# Patient Record
Sex: Female | Born: 1990 | Race: White | Hispanic: No | Marital: Single | Smoking: Never smoker
Health system: Southern US, Academic
[De-identification: ages and names within clinical notes are randomized; demographics above are authoritative.]

## PROBLEM LIST (undated history)

## (undated) DIAGNOSIS — F419 Anxiety disorder, unspecified: Secondary | ICD-10-CM

## (undated) DIAGNOSIS — D561 Beta thalassemia: Secondary | ICD-10-CM

## (undated) DIAGNOSIS — M419 Scoliosis, unspecified: Secondary | ICD-10-CM

## (undated) HISTORY — DX: Scoliosis, unspecified: M41.9

## (undated) HISTORY — PX: HX WISDOM TEETH EXTRACTION: SHX21

---
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
## 2012-01-08 ENCOUNTER — Emergency Department
Admission: EM | Admit: 2012-01-08 | Discharge: 2012-01-08 | Disposition: A | Discharge status: Home / Self Care | Attending: Emergency Medicine | Admitting: Emergency Medicine

## 2012-01-08 ENCOUNTER — Emergency Department (EMERGENCY_DEPARTMENT_HOSPITAL)

## 2012-01-08 DIAGNOSIS — S59909A Unspecified injury of unspecified elbow, initial encounter: Secondary | ICD-10-CM | POA: Insufficient documentation

## 2012-01-08 DIAGNOSIS — W010XXA Fall on same level from slipping, tripping and stumbling without subsequent striking against object, initial encounter: Secondary | ICD-10-CM | POA: Insufficient documentation

## 2012-01-08 MED ORDER — IBUPROFEN 800 MG TABLET
800.00 mg | ORAL_TABLET | ORAL | Status: DC
Start: 2012-01-08 — End: 2012-01-08

## 2012-01-08 NOTE — ED Nurses Note (Signed)
Initial nursing assessment complete. Upon arrival to the ED, pt is conscious, alert and oriented to person, place and time. Pt states she fell down approx. 6 steps and injured her right hand. Pt has mild deformity to the right hand.

## 2012-01-08 NOTE — ED Provider Notes (Signed)
Emergency Department Summary  ED Provider Note    HISTORY OF PRESENT ILLNESS    Narrative provider: Patient  Chief complaint: Hand Injury    Janet Kirby is a 20 y.o. female presenting with a chief complaint of fall approximately 2 hours ago. Note that the patient reports that the fall occurred while descending stairs, falling a total of 6 steps after her flip-flop caught the edge of the step. The patient reports 2 injuries, including a superficial abrasion to the left knee and more importantly, a right hand injury with continued pain and apparent deformity. The patient reports that she arrested the fall with the involved hand, but that the load was transmitted through the wrist rather than the hand as the hand was in full palmar flexion when the injury occurred. The patient now reports pain on the dorsal surface of the hand, numbness of the 3rd, 4th, and 5th digits, numbness above the wrist, and pain to palpation of the wrist. Range of motion is limited, both active and passive, most restricted in the 3rd, 4th, and 5th digits. Note that the patient is neurovascularly intact to the fingertips. The patient attempted ibuprofen therapy at home, but symptoms remained unchanged. Note that the fall was not witnessed, but the patient reports no head injury and no loss of conscious. Note that the patient does not have any history of syncope, hypertension, hypotension, seizure, arrhythmia, palpitations, nacrolepsy, or recent intoxication.    The patient reports no past medical history, except for [facility] for an upper respiratory infection. Symptoms associated with the upper respiratory infection have since improved. The patient is scheduled to complete her last dose of clarithromycin, prescribed for the upper respiratory infection, tomorrow.    The patient reports no other complaints and denies any other associated symptoms.    REVIEW OF SYSTEMS     Constitutional: No fever, chills, or fatigue  Skin: No rashes  Head: No change in hearing; no headache  Eyes: No change in vision  Cardiovascular: No chest pain, palpitations, or leg swelling  Respiratory: No cough or shortness of breath  Gastrointestinal: No abdominal pain, nausea, vomiting, constipation, or diarrhea  Genitourinary: No dysuria or frequency  Musculoskeletal: Positive for weakness and extremity pain  Neurological: No tingling, positive for numbness    All other review of systems negative.    PAST MEDICAL HISTORY    Primary care physician: [facility]    Past Medical History   Diagnosis Date   . Scoliosis      Past Surgical History   Procedure Date   . Hx no surgical procedures      Medications Prior to Admission     Medication    Clarithromycin (BIAXIN XL) 500 mg Oral Tablet Sustained Release 24 hr    Take 1,000 mg by mouth Once a day for 10 days    Benzonatate (TESSALON) 200 mg Oral Capsule    Take 1 Cap (200 mg total) by mouth Three times a day Prn cough    Drospirenone-Ethinyl Estradiol (YAZ 28) 3-20 mg-mcg Oral Tab    take 1 Tab by mouth Once a day.      fluticasone (FLONASE) 50 mcg/actuation Nasal Spray, Suspension    1 Spray by Each Nostril route Once a day.        No Known Allergies  History   Substance Use Topics   . Smoking status: Never Smoker    . Smokeless tobacco: Never Used   .  Alcohol Use: No     Family History   Problem Relation Age of Onset   . Healthy Mother    . Healthy Father      All other past medical history as documented in the History of Present Illness.    VITAL SIGNS    ED Triage Vitals   Enc Vitals Group      BP (Non-Invasive) 01/08/12 1221 110/76 mmHg      Heart Rate 01/08/12 1221 109       Respiratory Rate 01/08/12 1221 16       Temperature 01/08/12 1221 36 C (96.8 F)      Temp src --       SpO2-1 01/08/12 1221 99 %      Weight 01/08/12 1221 68.04 kg (150 lb)      Height 01/08/12 1221 1.626 m (5\' 4" )      Head Cir --       Peak Flow --       Pain Score --        Pain Loc --       Pain Edu? --       Excl. in GC? --      PHYSICAL EXAMINATION    Constitutional: No acute distress; pain score of 5/10 at baseline, increasing to 10/10 with articulation or palpation at the wrist or hand; alert and awake; oriented to person, place, time, and situation; good historian  HEENT:   Head: Normocephalic and atraumatic   Ears: Not examined   Eyes: PERRL; conjunctiva and sclera normal   Nose: No bleeding or deformity   Throat: Mucous membranes moist  Neck: Supple; ROM normal; no lymphadenopathy or thyromegaly  Heart: RRR; S1 and S2 present; no murmurs, rubs, or gallops  Lungs: No respiratory distress; no accessory muscle recruitment; breath sounds symmetric; no coughing, crackling, or wheezing  Abdomen: Soft, non-distended, and non-tender; no rebound tenderness; bowel sounds present; no guarding; no hepatosplenomegaly; no masses  Musculoskeletal: No edema; pain and additional pain to palpation of the dorsal surface of the right hand, numbness of the 3rd, 4th, and 5th digits on the right hand, numbness above the right wrist, and pain and pain to palpation of the right wrist; range of motion is limited, both active and passive, most restricted in the 3rd, 4th, and 5th digits on the right hand; neurovascularly intact to the fingertips  Skin: Warm and dry; no rash, erythema, or jaundice  Neurological: Awake and alert; oriented to person, place, time, and situation; affect and behavior normal; concentration and judgment normal    MEDICAL DECISION MAKING     After discussion with emergency department staff physician, Dr. Staci Righter, M.D., multiple diagnostic studies were ordered to evaluate for any acute musculoskeletal process including a right forearm XR, a right wrist 2 view XR, and a right hand XR. The right forearm XR demonstrated no fracture, destructive lesion or other significant abnormality. The right wrist 2 view XR demonstrated no fracture, destructive lesion or other significant abnormality. The right hand XR demonstrated no fracture, destructive lesion or other significant abnormality. After review of the imaging studies, the patient was placed in a soft split and provided with instructions on how to wrap and unwrap the splint.    The patient was advised to follow-up with orthopedics clinic to discuss her emergency department visit today. An appointment was requested with the orthopedics clinic in 1 week. The patient was instructed to continue taking over-the-counter medications for pain control, such as  ibuprofen (Motrin). The patient was also instructed to return to the emergency department if symptoms continue or worsen, and especially if symptoms include additional numbness or tingling in the fingers. The patient was advised to return to the emergency department with any chest pain, chest pressure, chest tightness, difficulty breathing, or shortness of breath. The patient was also advised to follow-up with her primary care physician, the [facility]. At the time of discharge, the patient voiced understanding of, and agreement with, the treatment plan including the plan to follow-up with the orthopedics clinic in 1 week.    Consultation: none    Disposition: discharged from emergency department with plan to follow-up with the orthopedic surgery service    Impression: right wrist injury without fracture    Dyann Ruddle, M.D.    Resident Physician, Internal Medicine  [facility]     HPIReview of SystemsPhysical ExamCourse

## 2012-01-08 NOTE — Discharge Instructions (Signed)
Please follow-up with orthopedic clinic to discuss your emergency department visit today. We have requested a follow-up appointment with orthopedics in 1 week. Your diagnosis today was right hand and wrist injury without fracture. We performed multiple diagnostic studies today including a right forearm XR, right hand XR, and right wrist XR. No fractures were discovered. We will recommend that you continue taking over-the-counter medications for pain control, such as ibuprofen (Motrin). Please return to the emergency department if symptoms continue or worsen, and especially if symptoms include additional numbness or tingling in the fingers. Please also return to the emergency department with any chest pain, chest pressure, chest tightness, difficulty breathing, or shortness of breath.

## 2012-01-08 NOTE — ED Attending Note (Signed)
 Note begun by:  Sharolyn JONETTA Idler, MD 01/08/2012, 12:48 PM    I was physically present and directly supervised this patient's care.  Patient seen and examined.  Resident / Vito / NP history and exam reviewed.   Key elements in addition to and/or correction of that documentation are as follows:    HPI :    21 y.o. female presents with chief complaint:  Hand Injury  Clemens earlier this AM on slipery stairs.      PE :     Filed Vitals:    01/08/12 1221   BP: 110/76   Pulse: 109   Temp: 36 C (96.8 F)   Resp: 16   SpO2: 99%      Decreased ROM secondary to pain.  Neuro intact.  R/U/M nerves intact motor/sensory    Data/Test :    EKG : None  Images Review by me : I personally reviewed all imaging studies  Image Reports Review by me : As above  Labs :   Labs Reviewed - No data to display      Review of Prior Data :       Prior Images : None  Prior EKG : None  Online Medical Records : None  Transfer Docs/Images : None    Clinical Impression :   Wrist pain following fall, likely contusion vs. Sprain     MDM :       ED Course :     Filed Vitals:    01/08/12 1221   BP: 110/76   Pulse: 109   Temp: 36 C (96.8 F)   Resp: 16   Height: 1.626 m (5' 4)   Weight: 68.04 kg (150 lb)   SpO2: 99%          Plan :     Orders Placed This Encounter   . XR HAND RIGHT   . XR WRIST RIGHT 2 VIEW   . XR FOREARM RIGHT     Wrist splint applied    The patient was advised to follow up with PCP/sports medicine.  They were advised to return to the emergency department if their symptoms worsen or any concerns.      Dispo :   Home     CRITICAL CARE : None

## 2012-01-19 ENCOUNTER — Ambulatory Visit (INDEPENDENT_AMBULATORY_CARE_PROVIDER_SITE_OTHER): Admitting: SURGERY OF THE HAND

## 2012-06-01 ENCOUNTER — Encounter (INDEPENDENT_AMBULATORY_CARE_PROVIDER_SITE_OTHER)

## 2012-06-01 ENCOUNTER — Ambulatory Visit (INDEPENDENT_AMBULATORY_CARE_PROVIDER_SITE_OTHER)

## 2012-06-01 ENCOUNTER — Encounter (FREE_STANDING_LABORATORY_FACILITY)
Admit: 2012-06-01 | Discharge: 2012-06-01 | Disposition: A | Discharge status: Home / Self Care | Attending: Family Medicine | Admitting: Family Medicine

## 2012-06-01 ENCOUNTER — Encounter (INDEPENDENT_AMBULATORY_CARE_PROVIDER_SITE_OTHER): Payer: Self-pay

## 2012-06-01 VITALS — BP 122/76 | HR 92 | Temp 97.7°F | Ht 64.0 in | Wt 153.8 lb

## 2012-06-01 DIAGNOSIS — Z113 Encounter for screening for infections with a predominantly sexual mode of transmission: Secondary | ICD-10-CM | POA: Insufficient documentation

## 2012-06-01 MED ORDER — DOXYCYCLINE HYCLATE 100 MG TABLET
100.00 mg | ORAL_TABLET | Freq: Two times a day (BID) | ORAL | Status: DC
Start: 2012-06-01 — End: 2012-07-10

## 2012-06-01 MED ORDER — DOXYCYCLINE MONOHYDRATE 100 MG TABLET
100.00 mg | ORAL_TABLET | Freq: Two times a day (BID) | ORAL | Status: DC
Start: 2012-06-01 — End: 2012-06-01

## 2012-06-01 MED ORDER — CEFTRIAXONE 500 MG SOLUTION FOR INJECTION
250.0000 mg | Freq: Once | INTRAMUSCULAR | Status: AC
Start: 2012-06-01 — End: 2012-06-01

## 2012-06-01 NOTE — Progress Notes (Addendum)
[facility]    Patient Name:  Janet Kirby  MRN:  [id number]  DOB:  [birth year]  Date of Service: 06/01/2012    Chief Complaint   Patient presents with   . Uti's     History of Present Illness: Suheily Birks is a 22 y.o. female who [facility] today for the above complaint.    Patient presents with complaints of suprapubic pain and dysuria that started 3-4 days prior. She states she had unprotected sex approximately 1 week prior and developed the symptoms subsequently. She denies having fevers or chills.     Main symptoms: Suprapubic pain, dysuria  Onset of symptoms: 3-4 days  Patient denies symptoms of: fever, vomiting and vaginal discharge  History of prior UTI's: none  Date of last sexual contact: 4 days  STI exposure: no known exporsure  Number of lifetime sexual partners: 3  Prior history of STI: none  Use of barrier protection: 50% of time  Over the counter medications: No    Past Medical History:    Past Medical History   Diagnosis Date   . Scoliosis      Past Surgical History:    Past Surgical History   Procedure Laterality Date   . Hx no surgical procedures       Allergies:  No Known Allergies    Medications:  Outpatient Prescriptions Marked as Taking for the 06/01/12 encounter (Office Visit) with Maree Krabbe, DO   Medication Sig   . cefTRIAXone (ROCEPHIN) Injection IM injection 250 mg by Intramuscular route One time for 1 dose   . doxycycline 100 mg Oral Tablet Take 1 Tab (100 mg total) by mouth Twice daily   . Drospirenone-Ethinyl Estradiol (YAZ 28) 3-20 mg-mcg Oral Tab take 1 Tab by mouth Once a day.     . Sertraline 20 mg/mL Oral Concentrate Take 2.5 mL by mouth Once a day     Social History:    History     Social History   . Marital Status: Single     Spouse Name: N/A     Number of Children: N/A   . Years of Education: N/A     Social History Main Topics   . Smoking status: Never Smoker    . Smokeless tobacco: Never Used   . Alcohol Use: No   . Drug Use: No    . Sexually Active: Not on file     Other Topics Concern   . Not on file     Social History Narrative    So from [place] [employer]     Family History:  Family History   Problem Relation Age of Onset   . Healthy Mother    . Healthy Father      Review of Systems:ROS is as per HPI    Physical Exam:  Vital signs:   Filed Vitals:    06/01/12 1604   BP: 122/76   Pulse: 92   Temp: 36.5 C (97.7 F)   TempSrc: Oral   Height: 1.626 m (5\' 4" )   Weight: 69.763 kg (153 lb 12.8 oz)   SpO2: 98%     Body mass index is 26.39 kg/(m^2).  Patient's last menstrual period was 05/16/2012.     General: No acute distress, alert & oriented x 3, pleasant  Lungs: Clear to auscultation bilaterally.   Cardiovascular: regular rate and rhythm, S1, S2 normal, no murmur, click, rub or gallop  Abdomen: Soft, non-tender, Bowel  sounds normal  Suprapubic exam: mild suprapubic tenderness  CVA Tenderness: no CVAT  Genitalia: Labia without erythema, blisters, ulcers, or warts, Vaginal wall without erythema, blisters, ulcers, or warts, Cervix with yellow discharge and Adenxa with mild right tenderness  Genital lesions: none    Data Reviewed:  POCT Results:    Time collected: 1632  Color: Yellow  Clarity: Cloudy  Leukocytes: Negative  Nitrite: Negative  Urobilinogen: 2mg /dL  Protein: Negative  pH: 6.5  Blood (urine): Negative  Specific Gravity: 1.020  Ketones: Negative  Bilirubin: Negative  Glucose: Negative             Assessment:   1. Cervicitis (616.0)  cefTRIAXone (ROCEPHIN) Injection IM injection, DISCONTINUED: doxycycline 100 mg Oral Tablet   2. Routine screening for STI (sexually transmitted infection) (V74.5)  Sertraline 20 mg/mL Oral Concentrate, GEN PROBE - STATE SUPPLIED - [facility] ONLY, POCT URINE DIPSTICK, POCT URINE PREGNANCY, URINE CULTURE       Plan:    Orders Placed This Encounter   . GEN PROBE - STATE SUPPLIED - [facility] ONLY   . Urine Culture   . POCT URINE DIPSTICK   . POCT URINE PREGNANCY    . cefTRIAXone (ROCEPHIN) Injection IM injection   . doxycycline 100 mg Oral Tablet     Cervicitis  - Gen probe sent  - UA negative  - Urine pregnancy negative  - Pelvic exam w/ mild yellow cervical discharge, pH normal. Mildly tender R adnexa  - Attending physician performed wet mount which was negative  - Gave Rocephin IM 250 mg once  - Gave script for doxycycline 100 mg BID x 7 days  - Patient encouraged to not have sex until Gen probe results are back.  - Return to clinic/ED if symptoms worsen or return      Patient seen with Dr. Doneen Poisson, DO  PGY-1  Department of Internal Medicine  [facility]  06/01/2012 6:27 PM    I saw and examined the patient.  I reviewed the resident's note.  I agree with the findings and plan of care as documented in the resident's note.  Any exceptions/additions are edited/noted.  Wet mount completed with no clue yeast and few wbc's    Gemma Payor, MD 06/01/2012, 10:38 PM

## 2012-06-03 LAB — URINE CULTURE

## 2012-06-14 ENCOUNTER — Other Ambulatory Visit (INDEPENDENT_AMBULATORY_CARE_PROVIDER_SITE_OTHER): Payer: Self-pay

## 2012-07-10 ENCOUNTER — Observation Stay (HOSPITAL_BASED_OUTPATIENT_CLINIC_OR_DEPARTMENT_OTHER): Admitting: Emergency Medicine

## 2012-07-10 ENCOUNTER — Encounter (HOSPITAL_COMMUNITY): Payer: Self-pay

## 2012-07-10 ENCOUNTER — Observation Stay
Admission: EM | Admit: 2012-07-10 | Discharge: 2012-07-11 | Disposition: A | Discharge status: Home / Self Care | Attending: Emergency Medicine | Admitting: Emergency Medicine

## 2012-07-10 DIAGNOSIS — F1010 Alcohol abuse, uncomplicated: Secondary | ICD-10-CM | POA: Insufficient documentation

## 2012-07-10 DIAGNOSIS — T3992XA Poisoning by unspecified nonopioid analgesic, antipyretic and antirheumatic, intentional self-harm, initial encounter: Secondary | ICD-10-CM | POA: Insufficient documentation

## 2012-07-10 DIAGNOSIS — F411 Generalized anxiety disorder: Secondary | ICD-10-CM | POA: Insufficient documentation

## 2012-07-10 DIAGNOSIS — T405X4A Poisoning by cocaine, undetermined, initial encounter: Principal | ICD-10-CM | POA: Insufficient documentation

## 2012-07-10 DIAGNOSIS — R300 Dysuria: Secondary | ICD-10-CM | POA: Insufficient documentation

## 2012-07-10 DIAGNOSIS — T39311A Poisoning by propionic acid derivatives, accidental (unintentional), initial encounter: Secondary | ICD-10-CM | POA: Insufficient documentation

## 2012-07-10 DIAGNOSIS — F3289 Other specified depressive episodes: Secondary | ICD-10-CM | POA: Insufficient documentation

## 2012-07-10 DIAGNOSIS — T50992A Poisoning by other drugs, medicaments and biological substances, intentional self-harm, initial encounter: Secondary | ICD-10-CM | POA: Insufficient documentation

## 2012-07-10 HISTORY — DX: Beta thalassemia (CMS HCC): D56.1

## 2012-07-10 HISTORY — DX: Anxiety disorder, unspecified: F41.9

## 2012-07-10 LAB — CBC/DIFF
BASOPHILS: 1 %
BASOPHILS: 1 %
BASOS ABS: 0.085 10*3/uL (ref 0.0–0.2)
BASOS ABS: 0.063 10*3/uL (ref 0.0–0.2)
EOS ABS: 0 THOU/uL (ref 0.0–0.5)
EOS ABS: 0.034 10*3/uL (ref 0.0–0.5)
EOSINOPHIL: 0 %
EOSINOPHIL: 1 %
HCT: 37.4 % (ref 33.5–45.2)
HCT: 34.3 % (ref 33.5–45.2)
HGB: 11.8 g/dL (ref 11.2–15.2)
HGB: 10.8 g/dL — ABNORMAL LOW (ref 11.2–15.2)
LYMPHOCYTES: 27 %
LYMPHOCYTES: 37 %
LYMPHS ABS: 2.295 10*3/uL (ref 1.0–4.8)
LYMPHS ABS: 2.702 THOU/uL (ref 1.0–4.8)
MCH: 19.7 pg — ABNORMAL LOW (ref 27.4–33.0)
MCH: 19.7 pg — ABNORMAL LOW (ref 27.4–33.0)
MCHC: 31.5 g/dL — ABNORMAL LOW (ref 32.5–35.8)
MCHC: 31.4 g/dL — ABNORMAL LOW (ref 32.5–35.8)
MCV: 62.6 fL — ABNORMAL LOW (ref 78–100)
MCV: 62.7 fL — ABNORMAL LOW (ref 78–100)
MONOCYTES: 5 %
MONOCYTES: 8 %
MONOS ABS: 0.425 10*3/uL (ref 0.3–1.0)
MONOS ABS: 0.556 10*3/uL (ref 0.3–1.0)
MPV: 8.3 fL (ref 7.5–11.5)
MPV: 7.9 fL (ref 7.5–11.5)
PLATELET COUNT: 265 THOU/uL (ref 140–450)
PLATELET COUNT: 251 THOU/uL (ref 140–450)
PMN ABS: 5.695 10*3/uL (ref 1.5–7.7)
PMN ABS: 3.967 10*3/uL (ref 1.5–7.7)
PMN'S: 67 %
PMN'S: 54 %
RBC: 5.97 MIL/uL — ABNORMAL HIGH (ref 3.63–4.92)
RBC: 5.47 MIL/uL — ABNORMAL HIGH (ref 3.63–4.92)
RDW: 15.3 % — ABNORMAL HIGH (ref 12.0–15.0)
RDW: 15 % (ref 12.0–15.0)
WBC: 8.5 10*3/uL (ref 3.5–11.0)
WBC: 7.3 10*3/uL (ref 3.5–11.0)

## 2012-07-10 LAB — URINALYSIS WITH MICROSCOPIC REFLEX IF INDICATED
BILIRUBIN: NEGATIVE
BLOOD: NEGATIVE
KETONES: NEGATIVE mg/dL
LEUKOCYTES: NEGATIVE
PROTEIN: NEGATIVE mg/dL
UROBILINOGEN: NORMAL mg/dL

## 2012-07-10 LAB — BASIC METABOLIC PANEL
ANION GAP: 11 mmol/L (ref 5–16)
ANION GAP: 6 mmol/L (ref 5–16)
BUN/CREAT RATIO: 12 (ref 6–22)
BUN/CREAT RATIO: 11 (ref 6–22)
BUN: 8 mg/dL (ref 6–20)
BUN: 7 mg/dL (ref 6–20)
CALCIUM: 9.3 mg/dL (ref 8.5–10.4)
CALCIUM: 8.6 mg/dL (ref 8.5–10.4)
CARBON DIOXIDE: 24 mmol/L (ref 22–32)
CARBON DIOXIDE: 28 mmol/L (ref 22–32)
CHLORIDE: 107 mmol/L (ref 96–111)
CHLORIDE: 107 mmol/L (ref 96–111)
CREATININE: 0.67 mg/dL (ref 0.49–1.10)
CREATININE: 0.66 mg/dL (ref 0.49–1.10)
ESTIMATED GLOMERULAR FILTRATION RATE: >59 ml/min/1.73m2 (ref >59)
ESTIMATED GLOMERULAR FILTRATION RATE: >59 ml/min/1.73m2 (ref >59)
GLUCOSE,NONFAST: 105 mg/dL (ref 65–139)
GLUCOSE,NONFAST: 111 mg/dL (ref 65–139)
POTASSIUM: 3.5 mmol/L (ref 3.5–5.1)
POTASSIUM: 3.7 mmol/L (ref 3.5–5.1)
SODIUM: 142 mmol/L (ref 136–145)
SODIUM: 141 mmol/L (ref 136–145)

## 2012-07-10 LAB — URINALYSIS, MICROSCOPIC
RBC'S: 1 /HPF (ref 0–4)
WBC'S: 2 /HPF (ref 0–6)

## 2012-07-10 LAB — THYROID STIMULATING HORMONE WITH FREE T4 REFLEX: THYROID STIMULATING HORMONE WITH FREE T4 REFLEX: 1.579 u[IU]/mL (ref 0.300–5.900)

## 2012-07-10 LAB — DRUG SCREEN, HIGH OPIATE CUTOFF, NO CONFIRMATION, URINE
AMPHETAMINE, URINE: NEGATIVE
BARBITURATE, URINE: NEGATIVE
BENZODIAZEPINE,URINE: NEGATIVE
BUPRENORPHINE, URINE QL: NEGATIVE
CANNABINOID (THC), URINE: NEGATIVE
COCAINE METAB. URINE: POSITIVE — AB
OPIATE, URINE: NEGATIVE
PHENCYCLIDINE, URINE: NEGATIVE
PROPOXYPHENE, URINE: NEGATIVE

## 2012-07-10 LAB — HEPATIC FUNCTION PANEL
ALBUMIN: 4.3 g/dL (ref 3.5–4.8)
ALKALINE PHOSPHATASE: 64 U/L (ref 38–126)
ALT (SGPT): 37 U/L (ref 7–45)
AST (SGOT): 31 U/L (ref 8–41)
BILIRUBIN, TOTAL: 0.9 mg/dL (ref 0.3–1.3)
BILIRUBIN,CONJUGATED: <0.2 mg/dl (ref 0.0–0.3)
TOTAL PROTEIN: 8 g/dL (ref 6.4–8.3)

## 2012-07-10 LAB — CREATINE KINASE (CK), TOTAL, SERUM OR PLASMA: CREATINE KINASE (CK): 111 U/L (ref 24–170)

## 2012-07-10 LAB — TRICYCLIC SCREEN: TRICYCLIC SCREEN: <150 ng/mL — AB

## 2012-07-10 LAB — URINALYSIS MACROSCOPIC WITH REFLEX TO MICROSCOPIC URINALYSIS (CULTURE NOT PERFORMED): PH URINE: 5 (ref 5.0–8.0)

## 2012-07-10 LAB — SALICYLATE LEVEL

## 2012-07-10 LAB — ACETAMINOPHEN LEVEL

## 2012-07-10 LAB — ETHANOL, SERUM
ETHANOL, SERUM: 175 mg/dL — AB
ETHANOL, SERUM: 11 mg/dL — AB

## 2012-07-10 LAB — MAGNESIUM: MAGNESIUM: 1.8 mg/dL (ref 1.7–2.5)

## 2012-07-10 LAB — SALICYLATE ACID LEVEL

## 2012-07-10 MED ORDER — SODIUM CHLORIDE 0.9 % (FLUSH) INJECTION SYRINGE
2.0000 mL | INJECTION | Freq: Three times a day (TID) | INTRAMUSCULAR | Status: DC
Start: 2012-07-10 — End: 2012-07-11

## 2012-07-10 MED ORDER — SODIUM CHLORIDE 0.9 % IV BOLUS
1000.00 mL | INJECTION | Status: AC
Start: 2012-07-10 — End: 2012-07-10
  Administered 2012-07-10: 0 mL via INTRAVENOUS
  Administered 2012-07-10: 1000 mL via INTRAVENOUS

## 2012-07-10 MED ORDER — SODIUM CHLORIDE 0.9 % INTRAVENOUS SOLUTION
INTRAVENOUS | Status: DC
Start: 2012-07-10 — End: 2012-07-11

## 2012-07-10 MED ORDER — ONDANSETRON HCL (PF) 4 MG/2 ML INJECTION SOLUTION
INTRAMUSCULAR | Status: AC
Start: 2012-07-10 — End: 2012-07-11
  Filled 2012-07-10: qty 2

## 2012-07-10 MED ORDER — ONDANSETRON HCL (PF) 4 MG/2 ML INJECTION SOLUTION
4.0000 mg | Freq: Four times a day (QID) | INTRAMUSCULAR | Status: DC | PRN
Start: 2012-07-10 — End: 2012-07-11

## 2012-07-10 MED ORDER — SODIUM CHLORIDE 0.9 % (FLUSH) INJECTION SYRINGE
2.0000 mL | INJECTION | INTRAMUSCULAR | Status: DC | PRN
Start: 2012-07-10 — End: 2012-07-11

## 2012-07-10 MED ADMIN — sodium chloride 0.9 % (flush) injection syringe: 2 mL | NDC 08290306547

## 2012-07-10 MED ADMIN — sodium chloride 0.9 % (flush) injection syringe: 0 mL

## 2012-07-10 NOTE — ED Nurses Note (Signed)
Pt assisted onto bedpan.  UA obtained and sent per orders.  Pt denies any further complaints at this time, will continue to monitor.

## 2012-07-10 NOTE — ED Nurses Note (Signed)
MD at bedside discussing plan of care.  Will continue to monitor.

## 2012-07-10 NOTE — Nurses Notes (Signed)
Patient want to leave [facility]. Med hosp 3 Janet Kirby advised patient could not leave until cleared by phycology.

## 2012-07-10 NOTE — H&P (Addendum)
[facility]   General Medicine  Admission H&P    Date of Service:  07/10/2012  Janet Kirby,Janet Kirby, 22 y.o. female  Date of Admission:  07/10/2012  Date of Birth:  [birth year]  PCP: Outside Medical Doctor    Information Obtained from: patient  Chief Complaint:  Suicide attempt/drug overdose    HPI: Traniece Boffa is a 22 y.o., White female with history of anxiety who presented to [facility] after a suicide attempt at home.    Approximately two weeks ago, her primary care doctor at home refused to further refill her Zoloft.  She says that, since that time, she had increasing sensations of feeling anxious and overwhelmed.  She really missed her parents and decided that she could no longer attend school at [employer] because she was too homesick.  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  She describes herself as "going downhill".    This evening, she was drinking alcohol with friends.  Did "a lot" of cocaine.  She called her parents on the phone, which caused her to become acutely depressed.  She doesn't remember much after that.  Her roommate says that she took a "whole bottle" of ibuprofen.  She did want to kill herself.  Very scared now because she has never felt that way before.    PAST MEDICAL:    Past Medical History   Diagnosis Date   . Scoliosis    . Anxiety    . Beta thalassemia     Past Surgical History   Procedure Laterality Date   . Hx wisdom teeth extraction          Medications:  Currently taking no medications.  Was taking the following at home until about 2 weeks ago:  Zoloft  OCPs  Xanax prn    Allergies:  No Known Allergies      Family History  Family History   Problem Relation Age of Onset   . Healthy Mother    . Healthy Father        Social History  History   Substance Use Topics   . Smoking status: Never Smoker    . Smokeless tobacco: Never Used   . Alcohol Use: Yes      Comment: weekend binge drinker        ROS:    Constitutional: recent feelings of fevers and chills.    Eyes: no blurry vision or double vision  Ears, nose, mouth, throat, and face: no sore throat, rhinorrhea, ear pain  Respiratory: no shortness of breath or cough.  does have URIs frequently that she attributes to being in college  Cardiovascular: no chest pain or palpitations  Gastrointestinal: + nausea, vomiting, diarrhea recently  Genitourinary:+ dysuria.  Did have some vaginal discomfort.  No discharge  Integument/breast: no rashes or lesions  Hematologic/lymphatic: history of beta thalassemia  Musculoskeletal:no muscle aches or pains  Neurological: no history of seizures  Behavioral/Psych: + anxiety and depression.  + suicide attempt  Endocrine: no history of diabetes and thyroid disease  Allergic/Immunologic: negative      Examination:  Temperature: 36.7 C (98.1 F) Heart Rate: 107 BP (Non-Invasive): 133/85 mmHg   Respiratory Rate: 20 SpO2-1: 97 %     General: appears in good health, acutely ill, appears younger than stated age and mild distress  Eyes: Conjunctiva clear., Pupils equal and round, reactive to light and accomodation. , Sclera non-icteric.   HENT:Head atraumatic and normocephalic,  ENT without erythema or injection, mucous membranes moist.  Neck: No JVD or thyromegaly or lymphadenopathy  Lungs: Clear to auscultation bilaterally.   Cardiovascular: regular rate and rhythm, S1, S2 normal, no murmur, click, rub or gallop  Abdomen: Soft, non-tender, Bowel sounds normal, non-distended  Genito-urinary: Deferred  Extremities: No cyanosis or edema  Skin: Skin warm and dry, No rashes and No lesions  Neurologic: Grossly normal  Lymphatics: No lymphadenopathy  Psychiatric: Affect Depressed, Anxious    Labs:    I have reviewed all lab results.    Imaging Studies: None obtained    DNR Status:  Full Code    Assessment/Plan:   Active Hospital Problems    Diagnosis   . Primary Problem: Suicide attempt   . Cocaine abuse   . Alcohol abuse    . Ibuprofen overdose   . Anxiety     22 year old female with history of anxiety and depression who presented after suicide attempt.    1.  Suicide attempt - secondary to acute overdose of cocaine and ibuprofen.  Currently, she is stable with some tachycardia.  EKG shows tachycardia but no evidence for acute ischemia.  She does not have chest pain.  Will monitor on telemetry overnight.  Will monitor creatinine, as she is really unsure of how much ibuprofen she took.  Hydrate with normal saline.  Will consult psychiatry in the morning.  Suicide precautions and sitter at bedside until cleared by psych.    2.  Anxiety/Depression - abruptly stopped taking her Zoloft at home because she could not get her physician to refill the prescription.  Consult psych.    3.  Dysuria - urinalysis not indicative for infection.  Will check for gonorrhea and chlamydia.    DVT/PE Prophylaxis: Not indicated    Freddie Apley. Mikey College, MD    Chief Resident  [facility]  Department of Internal Medicine      Marcene Corning, MD 07/10/2012, 5:19 AM    Patient seen and examined at bedside. Feels much better & answers the questions appropriately.  Mood is normal. No negative behavior noted.  Pregnancy test is negative. Will follow the ethanol level later.  Counseled the patient.    Erik Nessel Venora Maples, MD 07/10/2012, 10:48 AM      Spoke to the Psychiatry resident and he will evaluate the patient later today.  Will follow up.    Son Barkan Venora Maples, MD 07/10/2012, 11:25 AM

## 2012-07-10 NOTE — Nurses Notes (Signed)
-  pt arrived on unit with nurse in stable condition.  CA remained with patient for suicide precautions.  Pt denies suicidal thoughts at this time.  Friend at bedside.

## 2012-07-10 NOTE — Consults (Addendum)
Behavioral Medicine and Psychiatry Consultation / Liaison Service  History and Physical Exam    [name]  [id number]  [birth year]  07/10/2012    Identification: Janet Kirby is a 22 y.o. 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  Though she denies current SI, she is at high risk for suicide if no intervention is offered.   Recommended patient to be admitted to inpatient Psychiatric care.  Recommended patient to contact her parents in [place] to inform them about her hospitalization.  Patient is still tachycardic.  Please ensure medical clearance.  Patient is willing to stay in [place] overnight and be reevaluated by Psychiatric Consult team tomorrow.  Talked with her parents about her situation.  They are in agreement that she should be at lease being observed for 24 hours and get reevaluated.  The Adult Unit at [facility] does not have a female bed at this time.  Please continue Level II precaution and maintain sister at bedside.  History of Present Illness:     Janet Kirby is a 22 y.o. female, prelaw/ Forensic psychologist at [employer], presents to the hospital earlier this morning secondary to overdosing on a "whole bottle" of Ibuprofen as a suicidal attempt.  Patient was influenced by "a lot of cocaine" and was drinking with friends.  After calling her parents, she was becoming depressed and does not remember much afterward.  Psychiatry is consulted for safety assessment and intervention.  Patient reports she only took 5 ibuprofen and later changed to 20.  States she was going to a [employer] and her friend who was not a member brought a guy that she like, exacerbate her mood problem.  She tried cocaine, unknown amount but documented by other upon her admission as "a lot."  She was drunk and intoxicated, called her parents around 1 am.  Patient then proceeded to overdose, saying goodbye to her friends who found her.  She was found on the floor with her eyes closed and convulsing trying to vomit.  She did vomited some pills.  Pills were all over the place and it was stated that she took a "whole bottle."  Patient has stopped taking the Zoloft 50 mg daily two weeks prior.  She told the nurse that she could not get it filled and that she has been on this for a long time.  Patient told me that she only has been on Zoloft for anxiety for about 2 months and she was not complying with it and stopped 2 weeks ago.  She states she only used cocaine twice.  Denies other drugs.  States she has been drinking much more last year but dialed down to weekends only this school year as her classes are more difficult.  Denies any citation or DUI secondary to substances.  urine drug screen is positive for cocaine and BAL was 11 at the time she came in the ED.  Patient states she has been anxious all her life.  Her parents states that patient has not been herself with a lot of mood fluctuation and she has expressed to her parents that it feels like she is becoming a different person.  Her friend who is staying with her in the hospital states she shows signs of depression.  Patient states she was seen by her PCP and prescribed her Xanax then Ativan 1 mg daily PRN for anxiety along with Zoloft.  Last use of benzo was a month ago.    Patient denies being depressed, however is tearful.  She expresses that she would like to leave, thus down plays her symptoms and severity.  Patient states she is doing well in school at this time, GPA is around 3.4.  States she has started to miss a few quizzes but overall no significant issues.  Denies cardinal symptoms of mania, psychosis.  Denies history of trauma, sexual or physical abuse.    Past Psychiatric History:    Current and past outpatient providers: her PCP, Dr. Leland Her in [place]    Prior hospitalizations for psychiatric reasons: none    Suicide attempts: this is the first attempt    Medication trials: Xanax, Ativan, Zoloft.      Past Medical History:    Medical conditions: sinus tachycardic on recent EKG secondary to OD    Surgeries: denies    Current Medications: none    Allergies: NKDA    Seizure History: denies    CHI History: denies      Social History:  Patient is from [place] area in [place].  Her father worked for [employer] and retired.  Patient came to school here at [employer], pre Law major.  She is a sophomore with subjective report GPA of 3.4  Her family is intact.  She has an older brother who works with her dad.  Please see HPI for further history    Family History:  Mother with anxiety    Review of Systems:  Patient complains of anxious and recent SA, drinking alcohol and abusing cocaine.  All other review of systems are negative.      Physical Exam:  Filed Vitals:     07/10/12 0424 07/10/12 0506 07/10/12 0900 07/10/12 1159   BP: 144/77 133/85 109/62 133/73   Pulse: 115 107 60 86   Temp:  36.7 C (98.1 F) 36.8 C (98.2 F) 36.4 C (97.6 F)   Resp: 16 20 20 16    SpO2: 95% 97% 98% 98%     General: No acute distress  HEENT: Normocephalic atraumatic, mucous membranes moist, extraocular muscles intact  Heart: Regular rate and rhythm, no murmur  Lungs: Clear to auscultation bilaterally  Abdomen: Soft, non-tender, non-distended, normal abdominal sounds  Extremities: No edema  Neurologic: Strength and sensation intact in all extremities, gait normal, cranial nerves II through XII grossly intact  Skin: No rashes or lesions on exposed skin      Mental Status Examination  Mental Status Exam:  Janet Kirby is a dressed appropriately and tearful appearing 22 y.o. female . her speech is normal, no pressure. Patient  describes her mood as fine and her affect is appears sad and tearful . Patient's thought process appears linear and with content that contains no paranoia or delusions. In regards to suicidal and homicidal ideations she denies at this time. Patient's perceptual disturbances include not responding to internal stimuli. Insight and judgment are limited.  Cognitive functions are intact for immediate, delayed and  distant recalls.          Labs/Studies:   Results for orders placed during the hospital encounter of 07/10/12 (from the past 24 hour(s))   POCT WHOLE BLOOD GLUCOSE    Collection Time     07/10/12  2:23 AM       Result Value Range    GLUCOSE, POINT OF CARE 80  70 - 105 mg/dL   CBC/DIFF    Collection Time     07/10/12  2:25 AM       Result Value Range    WBC 8.5  3.5 - 11.0 THOU/uL    RBC 5.97 (*) 3.63 - 4.92 MIL/uL    HGB 11.8  11.2 - 15.2 g/dL    HCT 16.1  09.6 - 04.5 %    MCV 62.6 (*) 78 - 100 fL    MCH 19.7 (*) 27.4 - 33.0 pg    MCHC 31.5 (*) 32.5 - 35.8 g/dL    RDW 40.9 (*) 81.1 - 15.0 %    PLATELET COUNT 265  140 - 450 THOU/uL    MPV 8.3  7.5 - 11.5 fL    PMN'S 67       PMN ABS 5.695  1.5 - 7.7 THOU/uL    LYMPHOCYTES 27      LYMPHS ABS 2.295  1.0 - 4.8 THOU/uL    MONOCYTES 5      MONOS ABS 0.425  0.3 - 1.0 THOU/uL    EOSINOPHIL 0      EOS ABS 0.000  0.0 - 0.5 THOU/uL    BASOPHILS 1      BASOS ABS 0.085  0.0 - 0.2 THOU/uL    RBC MORPHOLOGY        Value: BLOOD FILM (SCREEN) SCANNED      MICROCYTOSIS-MODERATE      HYPOCHROMASIA-MARKED      OVALOCYTES-MODERATE   BASIC METABOLIC PANEL, NON-FASTING    Collection Time     07/10/12  2:25 AM       Result Value Range    SODIUM 142  136 - 145 mmol/L    POTASSIUM 3.5  3.5 - 5.1 mmol/L    CHLORIDE 107  96 - 111 mmol/L    CARBON DIOXIDE 24  22 - 32 mmol/L    ANION GAP 11  5 - 16 mmol/L    CREATININE 0.67  0.49 - 1.10 mg/dL    ESTIMATED GLOMERULAR FILTRATION RATE >59  >59 ml/min/1.71m2    GLUCOSE,NONFAST 105  65 - 139 mg/dL    BUN 8  6 - 20 mg/dL    BUN/CREAT RATIO 12  6 - 22    CALCIUM 9.3  8.5 - 10.4 mg/dL   THYROID STIMULATING HORMONE WITH FREE T4 REFLEX    Collection Time     07/10/12  2:25 AM       Result Value Range    THYROID STIMULATING HORMONE WITH FREE T4 REFLEX 1.579  0.300 - 5.900 uIU/mL   HCG, SERUM QUALITATIVE, PREGNANCY    Collection Time     07/10/12  2:25 AM       Result Value Range    PREGNANCY, SERUM QUALITATIVE NEGATIVE  NEGATIVE   ACETAMINOPHEN LEVEL    Collection Time     07/10/12  2:25 AM       Result Value Range    ACETAMINOPHEN <10 (*) 10 - 30 ug/mL    DATE OF LAST DOSE NOT STATED  TIME OF LAST DOSE NOT STATED         ETHANOL, SERUM    Collection Time     07/10/12  2:25 AM       Result Value Range    ETHANOL, SERUM 175 (*) NONE DETECTED mg/dL   SALICYLATE ACID LEVEL    Collection Time     07/10/12  2:25 AM       Result Value Range    SALICYLATE <4  2 - 30 mg/dL    DATE OF LAST DOSE NOT STATED          TIME OF LAST DOSE NOT STATED         TRICYCLIC SCREEN    Collection Time     07/10/12  2:25 AM       Result Value Range    TRICYCLIC SCREEN <150 (*) NO REFERENCE RANGES ESTABLISHED ng/mL     DATE OF LAST DOSE NOT STATED          TIME OF LAST DOSE NOT STATED         CREATINE KINASE (CK), TOTAL, SERUM    Collection Time     07/10/12  2:25 AM       Result Value Range    CREATINE KINASE (CK) 111  24 - 170 U/L   HEPATIC FUNCTION PANEL    Collection Time     07/10/12  2:25 AM       Result Value Range    ALBUMIN 4.3  3.5 - 4.8 g/dL    BILIRUBIN, TOTAL 0.9  0.3 - 1.3 mg/dL    ALKALINE PHOSPHATASE 64  38 - 126 U/L    AST (SGOT) 31  8 - 41 U/L    ALT (SGPT) 37  7 - 45 U/L    BILIRUBIN,CONJUGATED <0.2  0.0 - 0.3 mg/dl    TOTAL PROTEIN 8.0  6.4 - 8.3 g/dL   DRUG SCREEN, HIGH OPIATE CUTOFF, NO CONFIRMATION, URINE    Collection Time     07/10/12  3:15 AM       Result Value Range    AMPHETAMINE, URINE NEGATIVE  NEGATIVE    BARBITURATE, URINE NEGATIVE  NEGATIVE    BENZODIAZEPINE,URINE NEGATIVE  NEGATIVE    BUPRENORPHINE, URINE QL NEGATIVE  NEGATIVE    CANNABINOID (THC), URINE NEGATIVE  NEGATIVE    COCAINE METAB. URINE POSITIVE (*) NEGATIVE    METHADONE, URINE NEGATIVE  NEGATIVE    OPIATE, URINE NEGATIVE  NEGATIVE    PHENCYCLIDINE, URINE NEGATIVE  NEGATIVE    PROPOXYPHENE, URINE NEGATIVE  NEGATIVE   URINALYSIS WITH CULTURE REFLEX    Collection Time     07/10/12  3:15 AM       Result Value Range    CHARACTER CLEAR      COLOR COLORLESS      SPECIFIC GRAVITY, URINE 1.002 (*) 1.005 - 1.030    GLUCOSE NEGATIVE  NEGATIVE mg/dL    BILIRUBIN NEGATIVE  NEGATIVE    KETONES NEGATIVE  NEGATIVE mg/dL    BLOOD NEGATIVE  NEGATIVE    PH URINE 5.0  5.0 - 8.0    PROTEIN NEGATIVE  NEGATIVE mg/dL    UROBILINOGEN NORMAL  0.2 mg/dL    NITRITE NEGATIVE  NEGATIVE    LEUKOCYTES NEGATIVE  NEGATIVE   URINALYSIS, MICROSCOPIC    Collection Time     07/10/12  3:15 AM       Result Value Range    RBC'S    0 - 4 /  HPF    Value: 1  MICROSCOPIC ANALYSIS PERFORMED BY AUTOMATED METHOD    WBC'S 2  0 - 6 /HPF    BACTERIA MANY (*) OCL^OCCASIONAL OR LESS    SQUAMOUS EPITHELIAL RARE      IRIS COMMENTS Test Not Performed     CBC/DIFF    Collection Time      07/10/12  5:53 AM       Result Value Range    WBC 7.3  3.5 - 11.0 THOU/uL    RBC 5.47 (*) 3.63 - 4.92 MIL/uL    HGB 10.8 (*) 11.2 - 15.2 g/dL    HCT 16.1  09.6 - 04.5 %    MCV 62.7 (*) 78 - 100 fL    MCH 19.7 (*) 27.4 - 33.0 pg    MCHC 31.4 (*) 32.5 - 35.8 g/dL    RDW 40.9  81.1 - 91.4 %    PLATELET COUNT 251  140 - 450 THOU/uL    MPV 7.9  7.5 - 11.5 fL    PMN'S 54      PMN ABS 3.967  1.5 - 7.7 THOU/uL    LYMPHOCYTES 37      LYMPHS ABS 2.702  1.0 - 4.8 THOU/uL    MONOCYTES 8      MONOS ABS 0.556  0.3 - 1.0 THOU/uL    EOSINOPHIL 1      EOS ABS 0.034  0.0 - 0.5 THOU/uL    BASOPHILS 1      BASOS ABS 0.063  0.0 - 0.2 THOU/uL   BASIC METABOLIC PANEL, NON-FASTING    Collection Time     07/10/12  5:53 AM       Result Value Range    SODIUM 141  136 - 145 mmol/L    POTASSIUM 3.7  3.5 - 5.1 mmol/L    CHLORIDE 107  96 - 111 mmol/L    CARBON DIOXIDE 28  22 - 32 mmol/L    ANION GAP 6  5 - 16 mmol/L    CREATININE 0.66  0.49 - 1.10 mg/dL    ESTIMATED GLOMERULAR FILTRATION RATE >59  >59 ml/min/1.33m2    GLUCOSE,NONFAST 111  65 - 139 mg/dL    BUN 7  6 - 20 mg/dL    BUN/CREAT RATIO 11  6 - 22    CALCIUM 8.6  8.5 - 10.4 mg/dL   MAGNESIUM    Collection Time     07/10/12  5:53 AM       Result Value Range    MAGNESIUM 1.8  1.7 - 2.5 mg/dL   PHOSPHORUS    Collection Time     07/10/12  5:53 AM       Result Value Range    PHOSPHORUS 4.5  2.4 - 4.7 mg/dL   ETHANOL, SERUM    Collection Time     07/10/12 10:03 AM       Result Value Range    ETHANOL, SERUM 11 (*) NONE DETECTED mg/dL       Ruben Gottron, MD 11/16/2954, 2:16 PM      I discussed the patient.  I reviewed the resident's note.  I agree with the findings and plan of care as documented in the resident's note.  Any exceptions/additions are edited/noted.    Centrahoma Ropes, MD 07/10/2012, 4:55 PM

## 2012-07-10 NOTE — ED Nurses Note (Signed)
 Patient resting.  During reassessment of patient, patient states I no longer want to hurt or harm myself.  Will continue to monitor.

## 2012-07-10 NOTE — ED Nurses Note (Signed)
Report called to 7NE RN.  No further questions.

## 2012-07-10 NOTE — Care Management Notes (Signed)
Care Coordinator/Social Work Plan  [place]  Patient Name: Janet Kirby   MRN: [id number]   Acct Number: [id number]  DOB: 23-Dec-1990 Age: 22  **Admission Information**  Patient Type: OBSERVATION  Admit Date: 07/10/2012 Admit Time: 02:20  Admit Reason: DRUG OVERDOSE  Admitting Phys: Judi Saa [facility]  Attending Phys: Judi Saa [facility]  Unit: 7NE Bed: 7NE-09  160. LOC Notification, CMS Important Message / Detailed Notice  Created by : Rolanda Lundborg Date/Time 2012-07-10 13:10:37.000  Medical Necessity and Level Of Care Notification  Level of Care Met with pt/family/other and provided education on OBS patient status

## 2012-07-10 NOTE — ED Nurses Note (Addendum)
 0225 Patient arrived to ER via ambulance.  Per EMS patient overdosed on unknown amount of cocaine and motrin .  EMS states that patient has been depressed and was having thoughts of hurting herself.  Upon arrival, patient awake and oriented.  18 in Left AC PTA.  Labs drawn and sent. Patient placed on monitor.  MD present at bedside. Patient states I feel like such a disappointment to my family.  I have been feeling depressed about school for the past couple of months and I started having thoughts of hurting myself.  I have felt so overwhelmed lately.  Patient states I cannot remember how much motrin  and cocaine I took.  Sitter present at bedside. Will continue to monitor.

## 2012-07-10 NOTE — Care Plan (Signed)
 Problem: General Plan of Care(Adult,OB)  Goal: Individualization/Patient Specific Goal(Adult/OB)  Outcome: Ongoing (see interventions/notes)  Pt admitted for suicide attempt.  Pt overdosed on cocaine and motrin .  Pt under a lot of stress at school. [place].  Alert and oriented at this time.  Sitter at bedside for pt safety.

## 2012-07-10 NOTE — ED Attending Note (Signed)
Note begun by: Ezzie Dural, MD 07/10/2012, 2:42 AM    I was physically present and directly supervised this patient's care.  Patient was seen and examined.  The resident's history and exam were reviewed.  Key elements in addition to and/or correction of that documentation are as follows:  Vitals  Blood pressure 162/84, pulse 119, resp. rate 16, height 1.626 m (5\' 4" ), weight 70.308 kg (155 lb), SpO2 100.00%..  This is a 22 y.o. female.     Suicidal, took ibuprofen and cocaine. Depressed.        Old records were reviewed.    EKG was reviewed by myself. No st elevation      Disposition: Admitted    Clinical Impression:     Encounter Diagnoses   Name Primary?   . Cocaine abuse Yes   . Ibuprofen overdose    . Suicidal ideation    . Routine screening for STI (sexually transmitted infection)        Critical Care:  None

## 2012-07-11 LAB — CBC/DIFF
BASOPHILS: 1 %
BASOS ABS: 0.036 10*3/uL (ref 0.0–0.2)
EOS ABS: 0.311 10*3/uL (ref 0.0–0.5)
EOSINOPHIL: 5 %
HCT: 31.2 % — ABNORMAL LOW (ref 33.5–45.2)
HGB: 10 g/dL — ABNORMAL LOW (ref 11.2–15.2)
LYMPHOCYTES: 39 %
LYMPHS ABS: 2.46 THOU/uL (ref 1.0–4.8)
MCH: 20.1 pg — ABNORMAL LOW (ref 27.4–33.0)
MCHC: 32 g/dL — ABNORMAL LOW (ref 32.5–35.8)
MCV: 62.8 fL — ABNORMAL LOW (ref 78–100)
MONOCYTES: 9 %
MONOS ABS: 0.556 THOU/uL (ref 0.3–1.0)
MPV: 7.9 fL (ref 7.5–11.5)
PLATELET COUNT: 221 10*3/uL (ref 140–450)
PMN ABS: 2.961 THOU/uL (ref 1.5–7.7)
PMN'S: 47 %
RBC: 4.98 MIL/uL — ABNORMAL HIGH (ref 3.63–4.92)
RDW: 15.3 % — ABNORMAL HIGH (ref 12.0–15.0)
WBC: 6.3 10*3/uL (ref 3.5–11.0)

## 2012-07-11 LAB — PHOSPHORUS: PHOSPHORUS: 4 mg/dL (ref 2.4–4.7)

## 2012-07-11 LAB — BASIC METABOLIC PANEL
ANION GAP: 5 mmol/L (ref 5–16)
BUN/CREAT RATIO: 13 (ref 6–22)
BUN: 8 mg/dL (ref 6–20)
CALCIUM: 8.7 mg/dL (ref 8.5–10.4)
CARBON DIOXIDE: 25 mmol/L (ref 22–32)
CHLORIDE: 109 mmol/L (ref 96–111)
CREATININE: 0.63 mg/dL (ref 0.49–1.10)
ESTIMATED GLOMERULAR FILTRATION RATE: >59 ml/min/1.73m2 (ref >59)
GLUCOSE,NONFAST: 97 mg/dL (ref 65–139)
POTASSIUM: 3.6 mmol/L (ref 3.5–5.1)
SODIUM: 139 mmol/L (ref 136–145)

## 2012-07-11 LAB — MAGNESIUM: MAGNESIUM: 1.8 mg/dL (ref 1.7–2.5)

## 2012-07-11 LAB — NEISSERIA GONORRHOEAE DNA BY PCR: NEISSERIA GONORRHOEAE: NEGATIVE

## 2012-07-11 LAB — CHLAMYDIA TRACHOMITIS DNA BY PCR (INHOUSE): CHLAMYDIA TRACHOMATIS: NEGATIVE

## 2012-07-11 MED ORDER — SURGIFOAM SIZE 100 CM SPONGE
VAGINAL_SPONGE | CUTANEOUS | Status: AC
Start: 2012-07-11 — End: 2012-07-11
  Filled 2012-07-11: qty 2

## 2012-07-11 MED ORDER — SERTRALINE 20 MG/ML ORAL CONCENTRATE
2.5000 mL | Freq: Every day | ORAL | Status: AC
Start: 2012-07-11 — End: 2012-08-10

## 2012-07-11 MED ORDER — SODIUM CHLORIDE 0.9 %, BACTERIOSTATIC INJECTION SOLUTION
INTRAMUSCULAR | Status: AC
Start: 2012-07-11 — End: 2012-07-11

## 2012-07-11 MED ORDER — GELATIN (BULK) POWDER
Status: AC
Start: 2012-07-11 — End: 2012-07-11
  Filled 2012-07-11: qty 2

## 2012-07-11 MED ORDER — THROMBIN (RECOMBINANT) 5,000 UNIT TOPICAL SOLUTION
CUTANEOUS | Status: AC
Start: 2012-07-11 — End: 2012-07-11
  Filled 2012-07-11: qty 2

## 2012-07-11 MED ORDER — SURGICAL FIBRILLAR ABS HEMOSTAT OXIDIZED CELLULOSE 2X4 PAD
MEDICATED_PAD | TOPICAL | Status: AC
Start: 2012-07-11 — End: 2012-07-11
  Filled 2012-07-11: qty 1

## 2012-07-11 MED ORDER — LIDOCAINE-EPINEPHRINE 0.5 %-1:200,000 INJECTION SOLUTION
INTRAMUSCULAR | Status: AC
Start: 2012-07-11 — End: 2012-07-11
  Filled 2012-07-11: qty 50

## 2012-07-11 MED ORDER — GELATIN MATRIX SEALANT (FLOSEAL) 10 ML KIT
PACK | CUTANEOUS | Status: AC
Start: 2012-07-11 — End: 2012-07-11
  Filled 2012-07-11: qty 2

## 2012-07-11 MED ORDER — BACITRACIN ZINC 500 UNIT/GRAM TOPICAL OINTMENT
TOPICAL_OINTMENT | CUTANEOUS | Status: AC
Start: 2012-07-11 — End: 2012-07-11
  Filled 2012-07-11: qty 15

## 2012-07-11 MED ADMIN — sodium chloride 0.9 % (flush) injection syringe: 0 mL

## 2012-07-11 NOTE — Care Plan (Signed)
Problem: General Plan of Care(Adult,OB)  Goal: Plan of Care Review(Adult,OB)  The patient and/or their representative will communicate an understanding of their plan of care   Outcome: Ongoing (see interventions/notes)  Discharge Plan:  Home(Patient/Family Member/other) (code 1)  Pt admitted for an overdose ibuprofen alcohol cocaine in observation status pt was given obs letter yesterday and she stated she understood status. This morning psych note stated pt to go to [facility] for DC, and psych f/u today states pt wants to go home and they will set up out pt at [facility]. Called Psych MSW Thompkins and he called Psych to confirm plan to f/[facility]. Made  Dr. Phineas Semen aware of plan as well, and pt to DC to home today her friend plans on staying with her to comfort and make sure she is safe at home.

## 2012-07-11 NOTE — Nurses Notes (Signed)
 Discharge instructions reviewed with a verbal understanding noted by patient. Peripheral iv removed and tolerated well by patient. Patient walked herself down for discharge.

## 2012-07-11 NOTE — Discharge Summary (Signed)
DISCHARGE SUMMARY      PATIENT NAME:  Janet Kirby,Janet Kirby  MRN:  [id number]  DOB:  05-16-1990    ADMISSION DATE:  07/10/2012  DISCHARGE DATE:  07/11/2012    ATTENDING PHYSICIAN: Clyde Lundborg, MD  PRIMARY CARE PHYSICIAN: Outside Medical Doctor     CHIEF COMPLAINT:    Chief Complaint   Patient presents with   . Med G Evaluation     pt with SI, took overdose of  motrin and several lines of cocaine      DISCHARGE DIAGNOSIS:   Principle Problem: Suicide attempt  Active Hospital Problems    Diagnosis Date Noted   . Principle Problem: Suicide attempt 07/10/2012   . Cocaine abuse 07/10/2012   . Alcohol abuse 07/10/2012   . Ibuprofen overdose 07/10/2012   . Anxiety 07/10/2012   . Alcohol intoxication in episodic drinker 07/10/2012      Resolved Hospital Problems    Diagnosis    No resolved problems to display.     Active Non-Hospital Problems    Diagnosis Date Noted   . Scoliosis 07/10/2012      DISCHARGE MEDICATIONS:  Current Discharge Medication List      CONTINUE these medications which have CHANGED    Details   Sertraline 20 mg/mL Oral Concentrate Take 2.5 mL (50 mg total) by mouth Once a day for 30 days  Qty: 75 mL, Refills: 1    Associated Diagnoses: Routine screening for STI (sexually transmitted infection)         STOP taking these medications       Benzonatate (TESSALON) 200 mg Oral Capsule Comments:   Reason for Stopping:         doxycycline 100 mg Oral Tablet Comments:   Reason for Stopping:         Drospirenone-Ethinyl Estradiol (YAZ 28) 3-20 mg-mcg Oral Tab Comments:   Reason for Stopping:         fluticasone (FLONASE) 50 mcg/actuation Nasal Spray, Suspension Comments:   Reason for Stopping:             DISCHARGE INSTRUCTIONS:     SCHEDULE FOLLOW-UP OUTSIDE PHYSICIAN   Follow-up in: 2 WEEKS    Followup reason: After hospital discharge      DISCHARGE INSTRUCTION - DIET   Regular diet.   Diet: DRINK EXTRA FLUIDS          REASON FOR HOSPITALIZATION AND HOSPITAL COURSE:  This is a 22 y.o., female was brought to ER with acute alcohol and cocaine intoxication.  She was tachycardic and diaphoretic.Was admitted to tele,etry and hydrated aggressively.A sitter was ordered to watch the patient.  Psych evaluated the patient and they recommended observation for 24 hours.  Patient is more awake/alert and in better mood. No suicidal/homicidal ideation.  Cleared by psych and is being discharge from hospital.  She will follow up with her own PCP.      CONDITION ON DISCHARGE:  A. Ambulation: Full ambulation  B. Self-care Ability: Complete  C. Cognitive Status Alert    DISCHARGE DISPOSITION:  Home discharge         cc: Primary Care Physician:  Outside Medical Doctor  No address on file     [id number] Physician:  No referring provider defined for this encounter.   Referring providers can utilize [email or web address] to access their referred [employer] patient's information.    Febe Champa Venora Maples, MD

## 2012-07-11 NOTE — Progress Notes (Deleted)
[facility]   General Medicine     Date of Service: 07/10/2012   Janet Kirby,Janet Kirby, 22 y.o. female   Date of Admission: 07/10/2012   Date of Birth: July 05, 1990   PCP: Outside Medical Doctor in [place]  Information Obtained from: patient   Chief Complaint: Suicide attempt/drug overdose   HPI: Janet Kirby is a 22 y.o., White female with history of anxiety who presented to [facility] after a suicide attempt at home.   Approximately two weeks ago, her primary care doctor at home refused to further refill her Zoloft. She says that, since that time, she had increasing sensations of feeling anxious and overwhelmed. She really missed her parents and decided that she could no longer attend school at [employer] because she was too homesick. 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 She describes herself as "going downhill".   This evening, she was drinking alcohol with friends. Did "a lot" of cocaine. She called her parents on the phone, which caused her to become acutely depressed. She doesn't remember much after that. Her roommate says that she took a "whole bottle" of ibuprofen. She did want to kill herself. Very scared now because she has never felt that way before.     Patient is lying in her bed in no distress. Clinically better. Mod is better. Answers the questions very appropriately but still has a flat affect.  PAST MEDICAL:   Past Medical History  Past Surgical History    Diagnosis  Date  Procedure  Laterality  Date    .  Scoliosis   .  Hx wisdom teeth extraction      .  Anxiety      .  Beta thalassemia          Medications:   Currently taking no medications. Was taking the following at home until about 2 weeks ago:   Zoloft   OCPs   Xanax prn   Allergies:   No Known Allergies       Examination:   Temperature: 36.7 C (98.1 F)  Heart Rate: 107  BP (Non-Invasive): 133/85 mmHg    Respiratory Rate: 20  SpO2-1: 97 %      General: appears in good health, acutely ill, appears younger than stated age and mild distress   Eyes: Conjunctiva clear., Pupils equal and round, reactive to light and accomodation. , Sclera non-icteric.   HENT:Head atraumatic and normocephalic, ENT without erythema or injection, mucous membranes moist.   Neck: No JVD or thyromegaly or lymphadenopathy   Lungs: Clear to auscultation bilaterally.   Cardiovascular: regular rate and rhythm, S1, S2 normal, no murmur, click, rub or gallop   Abdomen: Soft, non-tender, Bowel sounds normal, non-distended   Genito-urinary: Deferred   Extremities: No cyanosis or edema   Skin: Skin warm and dry, No rashes and No lesions   Neurologic: Grossly normal   Lymphatics: No lymphadenopathy   Psychiatric: Affect Depressed, Anxious   Labs:   I have reviewed all lab results.   Results for orders placed during the hospital encounter of 07/10/12 (from the past 24 hour(s))   ETHANOL, SERUM       Result Value Range    ETHANOL, SERUM 11 (*) NONE DETECTED mg/dL   CBC/DIFF       Result Value Range    WBC 6.3  3.5 - 11.0 THOU/uL    RBC 4.98 (*) 3.63 - 4.92 MIL/uL    HGB  10.0 (*) 11.2 - 15.2 g/dL    HCT 16.1 (*) 09.6 - 45.2 %    MCV 62.8 (*) 78 - 100 fL    MCH 20.1 (*) 27.4 - 33.0 pg    MCHC 32.0 (*) 32.5 - 35.8 g/dL    RDW 04.5 (*) 40.9 - 15.0 %    PLATELET COUNT 221  140 - 450 THOU/uL    MPV 7.9  7.5 - 11.5 fL    PMN'S 47      PMN ABS 2.961  1.5 - 7.7 THOU/uL    LYMPHOCYTES 39      LYMPHS ABS 2.460  1.0 - 4.8 THOU/uL    MONOCYTES 9      MONOS ABS 0.556  0.3 - 1.0 THOU/uL    EOSINOPHIL 5      EOS ABS 0.311  0.0 - 0.5 THOU/uL    BASOPHILS 1      BASOS ABS 0.036  0.0 - 0.2 THOU/uL   BASIC METABOLIC PANEL, NON-FASTING       Result Value Range    SODIUM 139  136 - 145 mmol/L    POTASSIUM 3.6  3.5 - 5.1 mmol/L    CHLORIDE 109  96 - 111 mmol/L    CARBON DIOXIDE 25  22 - 32 mmol/L    ANION GAP 5  5 - 16 mmol/L    CREATININE 0.63  0.49 - 1.10 mg/dL     ESTIMATED GLOMERULAR FILTRATION RATE >59  >59 ml/min/1.66m2    GLUCOSE,NONFAST 97  65 - 139 mg/dL    BUN 8  6 - 20 mg/dL    BUN/CREAT RATIO 13  6 - 22    CALCIUM 8.7  8.5 - 10.4 mg/dL   MAGNESIUM       Result Value Range    MAGNESIUM 1.8  1.7 - 2.5 mg/dL   PHOSPHORUS       Result Value Range    PHOSPHORUS 4.0  2.4 - 4.7 mg/dL     DNR Status: Full Code   Assessment/Plan:   Active Hospital Problems     Diagnosis    .  Primary Problem: Suicide attempt    .  Cocaine abuse    .  Alcohol abuse    .  Ibuprofen overdose    .  Anxiety      22 year old female with history of anxiety and depression who presented after suicide attempt.   1. Suicide attempt - secondary to acute overdose of cocaine and ibuprofen. Currently, she is stable with some tachycardia. EKG shows tachycardia but no evidence for acute ischemia. She does not have chest pain. Will monitor on telemetry overnight. Will monitor creatinine, as she is really unsure of how much ibuprofen she took. Hydrate with normal saline. Psychiatry has evaluated the patient and will see her later today again. Suicide precautions and sitter at bedside until cleared by psych.   2. Anxiety/Depression - abruptly stopped taking her Zoloft at home because she could not get her physician to refill the prescription. Will defer that to Psych.  3. Dysuria - urinalysis not indicative for infection. Will check for gonorrhea and chlamydia.   DVT/PE Prophylaxis: Not indicated

## 2012-07-11 NOTE — Care Management Notes (Signed)
[facility] Management Initial Evaluation    Patient Name: Janet Kirby  Date of Birth: [birth year]  Sex: female  Date/Time of Admission: 07/10/2012  2:20 AM  Room/Bed: 7NE/09  Payor: TRICARE  Plan: [facility]  Product Type: Managed Care    PCP: Outside Medical Doctor    Pharmacy Info:   Preferred Pharmacy    [facility] - [place], [place] - [address] AT [facility] [address]    [address] [facility] [place] [address]    Phone: [phone] Fax: [phone]    Open 24 Hours?: No        Emergency Contact Info:   Extended Emergency Contact Information  Primary Emergency Contact: Alvin Critchley [place]  Home Phone: [phone]  Work Phone: [phone]  Mobile Phone: [phone]  Relation: Mother  Secondary Emergency Contact: Clarisa Schools [place]  Mobile Phone: [phone]  Relation: Friend    History:   Janet Kirby is a 22 y.o., female, admitted overdose     Height/Weight: 162.6 cm (5\' 4" ) / 71.2 kg (156 lb 15.5 oz)     LOS: 1 day   Admitting Diagnosis: DRUG OVERDOSE    Assessment:    07/11/12 1611   Assessment Details   Assessment Type Admission   Date of Care Management Update 07/11/12   Date of Next DCP Update 07/14/12   Care Management Plan   Discharge Planning Status initial meeting   Projected Discharge Date 07/11/12   CM will evaluate for rehabilitation potential yes   Patient choice offered to patient/family yes   Discharge Needs Assessment   Outpatient/Agency/Support Group Needs none   Equipment Currently Used At Home none   Equipment Needed After Discharge none   Discharge Facility/Level of Care Needs Home (Patient/Family Member/other)(code 1)   Transportation Available car;family or friend will provide   Referral Information   Admission Type inpatient   Address Verified verified-no changes   Arrived From emergency department   Insurance Verified verified-no change    ADVANCE DIRECTIVES   !! Does the Patient have an Advance Directive? No, Information Offered and Given   Employment/Financial   Patient has Prescription Coverage?  Yes       Name of Insurance Coverage for Medications VA benefits    Living Environment   Lives With alone  (roomate, friend willing to stay )   Living Arrangements apartment   Home Safety   Home Assessment: No Problems Identified   Home Accessibility no concerns       Discharge Plan:  Home(Patient/Family Member/other) (code 1)  Pt admitted for an overdose ibuprofen alcohol cocaine in observation status pt was given obs letter yesterday and she stated she understood status. This morning psych note stated pt to go to [facility] for DC, and psych f/u today states pt wants to go home and they will set up out pt at [facility]. Called Psych MSW Thompkins and he called Psych to confirm plan [facility]. Made  Dr. Phineas Semen aware of plan as well, and pt to DC to home today her friend plans on staying with her to comfort and make sure she is safe at home.     The patient will continue to be evaluated for developing discharge needs.     Case Manager: Kevin Fenton, RN 07/11/2012, 4:15 PM  Phone: [phone]

## 2012-07-11 NOTE — Consults (Addendum)
[facility]  Follow Up Psych Consult    Janet Kirby  [id number]  [birth year]    Date of Service: 07/11/2012    Requesting MD: Dr. Phineas Semen, MD  Chief Complaint/Reason for consult:  "overdose"  ID:  Janet Kirby is a 22 y.o. year-old female.    S: Patient states that she is no longer feeling suicidal and that she would like to be discharged home. Patient is a Consulting civil engineer at [employer] and cites feeling regretful over her recent ingestion and states "it wasn't even that many", despite conflicting reports of ingestion (5-20 per chart review). She endorses drinking and coming down from using cocaine, which is her 2nd use ever. She endorses drinking occasionally but denies daily use. Patient also stopped her Zoloft several weeks prior to admission. Today, she cites she is very happy to be alive. She denies previous suicide attempts. She denies planning this attempt. She endorses a lot of stress in her life regarding school. Patient gives verbal consent to speak with mother Alexia Freestone [phone]) with the exception that I do not discuss her cocaine use.     Patient's mother is aware of what happened and feels "it was a cry for attention, I just think she realized this isn't the type of attention she wants. She does not feel patient would be at high risk for suicide if she was released. She cites that parents have been supportive and patient has been telling them about stress recently. They are planning to move her to a smaller college upon completion of this semester.     Patient denies access to large amounts of medications, guns, knives or other weapons. Patient has friend present with her Annice Needy) who is willing to stay with patient upon discharge and vouche for her safety until follow up.     (1 of the following (past F/M/S hx) is needed needed for level 4 visit.)  Past Family History:   Family History   Problem Relation Age of Onset   . Healthy Mother    . Healthy Father       Past Medical History:    Past Medical History   Diagnosis Date   . Scoliosis    . Anxiety    . Beta thalassemia        Past social History:   History     Social History   . Marital Status: Single     Spouse Name: N/A     Number of Children: N/A   . Years of Education: N/A     Occupational History   . Not on file.     Social History Main Topics   . Smoking status: Never Smoker    . Smokeless tobacco: Never Used   . Alcohol Use: Yes      Comment: weekend binge drinker   . Drug Use: Yes      Comment: cocaine   . Sexually Active: Not on file     Other Topics Concern   . Not on file     Social History Narrative    So from [place] major [employer]     ROS: (2+ systems needed for level 4 visit.)   Constitutional:  No acute distress  Psych:  +depression, anxiety, denies SI      Exam: (2+ exam areas needed for level 4 visit.)   Blood pressure 115/69, pulse 96, temperature 36.6 C (97.9 F), resp. rate 20, height 1.626 m (5\' 4" ), weight 71.2 kg (156 lb 15.5 oz),  last menstrual period 06/12/2012, SpO2 98.00%.  MSE:    Appearance:  casually dressed   Behavior:  cooperative   Motor/MS:  normal gait   Speech:  normal   Memory:  grossly normal   Mood:  anxious   Affect:  consistent with mood   Perception:  normal   Thought:  goal directed/coherent   Thought Content:  appropriate    Suicidal Ideation:  none.     Homicidal Ideation:  none   Level of Consciousness:  alert   Orientation:  grossly normal   Judgement:  fair   Conceptual:  abstract thinking    Labs:   No results found for this basename: TSH      Urine Drug Screen   No results found for this basename: AMPHETUR, BARBUR, BENZOUR, COCMETUR, METHADONEUR, OPIATEURINE, PHENCYCUR, PROPOXYPHEUR, CANNABINUR        Lab Results   Component Value Date    ETHANOLSERUM 11* 07/10/2012     I have reviewed all lab results.  Lab Results for Last 24 Hours:    Results for orders placed during the hospital encounter of 07/10/12 (from the past 24 hour(s))   CBC/DIFF       Result Value Range     WBC 6.3  3.5 - 11.0 THOU/uL    RBC 4.98 (*) 3.63 - 4.92 MIL/uL    HGB 10.0 (*) 11.2 - 15.2 g/dL    HCT 16.1 (*) 09.6 - 45.2 %    MCV 62.8 (*) 78 - 100 fL    MCH 20.1 (*) 27.4 - 33.0 pg    MCHC 32.0 (*) 32.5 - 35.8 g/dL    RDW 04.5 (*) 40.9 - 15.0 %    PLATELET COUNT 221  140 - 450 THOU/uL    MPV 7.9  7.5 - 11.5 fL    PMN'S 47      PMN ABS 2.961  1.5 - 7.7 THOU/uL    LYMPHOCYTES 39      LYMPHS ABS 2.460  1.0 - 4.8 THOU/uL    MONOCYTES 9      MONOS ABS 0.556  0.3 - 1.0 THOU/uL    EOSINOPHIL 5      EOS ABS 0.311  0.0 - 0.5 THOU/uL    BASOPHILS 1      BASOS ABS 0.036  0.0 - 0.2 THOU/uL   BASIC METABOLIC PANEL, NON-FASTING       Result Value Range    SODIUM 139  136 - 145 mmol/L    POTASSIUM 3.6  3.5 - 5.1 mmol/L    CHLORIDE 109  96 - 111 mmol/L    CARBON DIOXIDE 25  22 - 32 mmol/L    ANION GAP 5  5 - 16 mmol/L    CREATININE 0.63  0.49 - 1.10 mg/dL    ESTIMATED GLOMERULAR FILTRATION RATE >59  >59 ml/min/1.75m2    GLUCOSE,NONFAST 97  65 - 139 mg/dL    BUN 8  6 - 20 mg/dL    BUN/CREAT RATIO 13  6 - 22    CALCIUM 8.7  8.5 - 10.4 mg/dL   MAGNESIUM       Result Value Range    MAGNESIUM 1.8  1.7 - 2.5 mg/dL   PHOSPHORUS       Result Value Range    PHOSPHORUS 4.0  2.4 - 4.7 mg/dL   A/P  This is a 22 year old female who was admitted to medicine for observation after overdose attempt of Ibuprofen.     -  patient is cleared for discharge from a psychiatric perspective.   -will arrange outpatient follow up for patient at [facility].      (please mark as appropriate):       x        I reviewed lab tests.               I ordered or recommended ordering lab tests.               I reviewed radiological tests.               I ordered or recommended ordering radiological tests.               I discussed test results with the performing physician.               I reviewed and summarized old records.         x      I obtained and documented history from others.                I independently interpreted a test that was also interpreted by another physician.         x      I decided to obtain old records and/or history from someone other than the patient.        Kathi Der Kudurogianis, DO 07/11/2012, 11:46 AM    D/C planning needs to include appt with psychiatrist and arrangements made for counseling @ [facility].    I saw and examined the patient.  I reviewed the resident's note.  I agree with the findings and plan of care as documented in the resident's note.  Any exceptions/additions are edited/noted.    Lauretta Chester, MD 07/11/2012, 6:55 PM

## 2012-07-27 ENCOUNTER — Ambulatory Visit (HOSPITAL_COMMUNITY): Admitting: Psychiatry

## 2013-02-01 ENCOUNTER — Encounter (INDEPENDENT_AMBULATORY_CARE_PROVIDER_SITE_OTHER): Payer: Self-pay

## 2013-02-01 ENCOUNTER — Ambulatory Visit (INDEPENDENT_AMBULATORY_CARE_PROVIDER_SITE_OTHER)

## 2013-02-01 VITALS — BP 135/83 | HR 96 | Temp 97.6°F | Resp 16 | Ht 63.94 in | Wt 153.0 lb

## 2013-02-01 MED ORDER — SULFACETAMIDE SODIUM 10 % EYE DROPS
1.00 [drp] | OPHTHALMIC | Status: DC
Start: 2013-02-01 — End: 2013-12-21

## 2013-02-01 NOTE — Patient Instructions (Addendum)
[facility]      Operated by [facility]  [address] [place], [place] [address]  Phone: [phone])  Fax: [phone]  [email or web address]  Open Daily 8:00a - 8:00p    Closed Thanksgiving and Christmas Day  [facility]     Operated by [facility]  [address], [place] [address]  Phone: [phone])  Fax: [phone]  [email or web address]  Open M-F  8:00a - 8:00p  Sat & Sun  10:00a - 4:00p  Closed all [place] Holidays        Attending Caregiver: Annia Belt, MD      Today's orders:   Orders Placed This Encounter   . sulfacetamide sodium (BLEPH-10) 10 % Ophthalmic Drops        Prescription(s) E-Rx to:  [facility] - [place], [place] - [address] AT [address]    ________________________________________________________________________  Short Term Disability and Family Medical Leave Act  [facility] does NOT provide assistance with any disability applications.  If you feel your medical condition requires you to be on disability, you will need to follow up with  Your primary care physician or a specialist.  We apologize for any inconvenience.    For Medication Prescribed by [facility]:  As an Urgent Care facility, our clinic does NOT offer prescription refills over the telephone.    If you need more of the medication one of our medical providers prescribed, you will  Either need to be re-evaluated by [place] or see your primary care physician.    ________________________________________________________________________      It is very important that we have a phone number that is the single best way to contact you in the event that we become aware of important clinical information or concerns after your discharge.  If the phone number you provided at registration is NOT this number you should inform staff and registration prior to leaving.       Your treatment and evaluation today was focused on identifying and treating potentially emergent conditions based on your presenting signs, symptoms, and history.  The resulting initial clinical impression and treatment plan is not intended to be definitive or a substitute for a full physical examination and evaluation by your primary care provider.  If your symptoms persist, worsen, or you develop any new or concerning symptoms, you need to be evaluated.      If you received x-rays during your visit, be aware that the final and formal interpretation of those films by a radiologist may occur after your discharge.  If there is a significant discrepancy identified after your discharge, we will contact you at th telephone number provided at registration.      If you received a pelvic exam, you may have cultures pending for sexually transmitted diseases.  Positive cultures are reported to the [facility] Department of Health as required by state law.  You should be contacted if you cultures are positive.  We will not contact you if they are negative.  You may contact the Health Information Management Office of [facility] to get a copy of your results.  You did NOT receive a PAP smear (the screening test for cervical).  This specific test for women is best performed by your gynecologist or primary care provider when indicated.      If you are over 4 year old, we cannot discuss your personal health information with  a parent, spouse, family member, or anyone else without your express consent.  This does not include those who have legitimate access to your records and information to assist in your care under the provisions of HIPAA [facility] Portability and Accountability Act) law, or those to whom you have previously given express written consent to do so, such a legal guardian or Power of [place].       You may have received medication that may cause you to feel drowsy and/or light headed for several hours.  You may even experience some amnesia of your stay.  You should avoid operating a motor vehicle or performing any activity requiring complete alertness or coordination until you feel fully awake (approximately 24-48 hours).  Avoid alcoholic beverages.  You may also have a dry mouth for several hours.  This is a normal side effect and will disappear as the effects of the medication wear off.      Instructions discussed with patient upon discharge by clinical staff with all questions answered.  Please call [facility] [phone]) if any further questions.  Go immediately to the emergency department if any concern or worsening symptoms.      Annia Belt, MD 02/01/2013, 7:44 PM      Conjunctivitis  Conjunctivitis is commonly called "pink eye." Conjunctivitis can be caused by bacterial or viral infection, allergies, or injuries. There is usually redness of the lining of the eye, itching, discomfort, and sometimes discharge. There may be deposits of matter along the eyelids. A viral infection usually causes a watery discharge, while a bacterial infection causes a yellowish, thick discharge. Pink eye is very contagious and spreads by direct contact.  You may be given antibiotic eyedrops as part of your treatment. Before using your eye medicine, remove all drainage from the eye by washing gently with warm water and cotton balls. Continue to use the medication until you have awakened 2 mornings in a row without discharge from the eye. Do not rub your eye. This increases the irritation and helps spread infection. Use separate towels from other household members. Wash your hands with soap and water before and after touching your eyes. Use cold compresses to reduce pain and sunglasses to relieve irritation from light. Do not wear contact lenses or wear eye makeup until the infection is gone.  SEEK MEDICAL CARE IF:     Your symptoms are not better after 3 days of treatment.   You have increased pain or trouble seeing.   The outer eyelids become very red or swollen.  Document Released: 06/04/2004 Document Revised: 07/20/2011 Document Reviewed: 04/27/2005  [facility] Patient Information 2014 [place], [place].

## 2013-02-01 NOTE — Progress Notes (Signed)
History of Present Illness: Janet Kirby is a 22 y.o. female who [facility] today with chief complaint of    Chief Complaint    Eye Irritation          Awoke with both eyes red, crusted and yellow-green d/c. Never had pink eye. + pain, they feel heavy. She has sl HA and L ear ache. Ear is sl better but not well tonight. No recent cold sx- has stuffy nose- baseline. Vision is blurry, not doubled. No eye injury, no foreign body sensation and no itching. Pain is throbbing, bilat. She is sensitive to light. She does not wear contacts, has reading glasses. Hasnt' tried to treat eyes with anything.  Tends to get sinus infection as weather changes. Feels weak all over (Arms & legs) and has sl nasal congestion for a few days    I reviewed and confirmed the patient's past medical history taken by the nurse or medical assistant with the addition of the following:    Past Medical History:    Past Medical History   Diagnosis Date   . Scoliosis    . Anxiety    . Beta thalassemia        Past Surgical History:      Past Surgical History   Procedure Laterality Date   . Hx wisdom teeth extraction       Allergies:  No Known Allergies  Medications:    Outpatient Prescriptions Marked as Taking for the 02/01/13 encounter (Office Visit) with [facility]   Medication Sig   . ETHINYL ESTRADIOL/DROSPIRENONE (YAZ, 28, ORAL) Take by mouth   . sulfacetamide sodium (BLEPH-10) 10 % Ophthalmic Drops Instill 1 Drop into both eyes Every 3 hours     Social History:  [employer] administrator from [place]  History   Substance Use Topics   . Smoking status: Never Smoker    . Smokeless tobacco: Never Used   . Alcohol Use: Yes      Comment: weekend binge drinker       Family History: No significant family history.  Family History   Problem Relation Age of Onset   . Healthy Mother    . Healthy Father    . Cancer Paternal Grandmother      throat- smoker   . Cancer Paternal Grandfather      throat- smoker   . Diabetes Neg Hx       Review of Systems:    Pulmonary:   no dry cough, no productive cough, no wheezing and no SOB  Cardiovascular:  no chest pain  Gastrointestinal:  no nausea, no vomiting and no diarrhea  Genitourinary:  no dysuria  Musculoskeletal:  no myalgias  Neurologic:  no paresthesias, no weakness and no headache  Skin:  no rash  Heme/Lymph:  no easy bruising  Allergy/Immune:  no seasonal allergies    Physical Exam:  Vital signs:   LMP Vitals   Item Reading   . BP 135/83   . Pulse 96   . Temp 36.4 C (97.6 F)   . Temp Src Thermal Scan   . Resp 16   . Ht 1.624 m (5' 3.94")   . Wt 69.4 kg (153 lb)   . LMP 02/01/2013     20/30 OU    General: appears in good health, no distress and vital signs reviewed  Eyes: Conjunctiva injected OU., PERRLA, EOM's intact. Fundi benign. Globes not tender. NO Periorbital edema or pain. Sl  Photophobia.   HENT:TM's clear, nose without erythema, oropharynx without erythema, exudate, or blistering  Neck: No anterior or posterior cervical adenopathy, Neck supple with FROM and thyroid: not enlarged, symmetric, no tenderness/mass/nodules  Lungs: Clear to auscultation bilaterally.   Cardiovascular: regular rate and rhythm, S1, S2 normal, no murmur, click, rub or gallop  Skin: Skin warm and dry and No rashes  Neurologic: Gait is normal.  , Alert and oriented x3  Psychiatric: Affect Normal, Behavior normal, Speech normal  Data Reviewed:    Not applicable    Course: Condition at discharge: Good     Differential Diagnosis: conjunctivitis bacterial vs viral     Assessment:   1. Acute conjunctivitis, bilateral        Plan:    Orders Placed This Encounter   . sulfacetamide sodium (BLEPH-10) 10 % Ophthalmic Drops          Medications as ordered.  Medication side effects discussed and patient verbalized understanding.  Patient agrees with above treatment.  Questions answered to patients satisfaction.  Risk, benefits, and alternatives to above treatment discussed with patient.   After regular student health clinic hours, if your symptoms worsen or you develop new symptoms you may seek care at an urgent care center or the ER.  Careful handwashing and use of tissues.     F/U if not better in 1 week.   Toss eye makeup and eye makeup brushes.  Go to Emergency Department immediately for further work up if any concerning symptoms.  Plan was discussed and patient verbalized understanding.  If symptoms are worsening or not improving the patient should return to the Urgent Care ([facility]) or [facility] for further evaluation.    Annia Belt, MD 02/02/2013, 11:00 PM

## 2013-03-04 ENCOUNTER — Other Ambulatory Visit: Payer: Self-pay

## 2013-03-06 ENCOUNTER — Other Ambulatory Visit: Payer: Self-pay

## 2013-12-21 ENCOUNTER — Ambulatory Visit (INDEPENDENT_AMBULATORY_CARE_PROVIDER_SITE_OTHER)

## 2013-12-21 ENCOUNTER — Encounter (INDEPENDENT_AMBULATORY_CARE_PROVIDER_SITE_OTHER): Payer: Self-pay

## 2013-12-21 VITALS — BP 157/94 | HR 99 | Temp 99.5°F | Resp 16 | Ht 63.0 in | Wt 155.0 lb

## 2013-12-21 DIAGNOSIS — J020 Streptococcal pharyngitis: Principal | ICD-10-CM

## 2013-12-21 DIAGNOSIS — R059 Cough, unspecified: Secondary | ICD-10-CM

## 2013-12-21 DIAGNOSIS — R05 Cough: Secondary | ICD-10-CM

## 2013-12-21 MED ORDER — IBUPROFEN 800 MG TABLET
800.00 mg | ORAL_TABLET | Freq: Three times a day (TID) | ORAL | Status: DC | PRN
Start: 2013-12-21 — End: 2014-03-02

## 2013-12-21 MED ORDER — AMOXICILLIN 500 MG TABLET
500.00 mg | ORAL_TABLET | Freq: Three times a day (TID) | ORAL | Status: AC
Start: 2013-12-21 — End: 2013-12-31

## 2013-12-21 MED ORDER — BENZONATATE 100 MG CAPSULE
100.00 mg | ORAL_CAPSULE | Freq: Three times a day (TID) | ORAL | Status: DC
Start: 2013-12-21 — End: 2014-03-02

## 2013-12-21 MED ORDER — ALBUTEROL SULFATE HFA 90 MCG/ACTUATION AEROSOL INHALER
1.0000 | INHALATION_SPRAY | Freq: Four times a day (QID) | RESPIRATORY_TRACT | Status: DC | PRN
Start: 2013-12-21 — End: 2014-03-02

## 2013-12-21 MED ORDER — PREDNISONE 50 MG TABLET
50.0000 mg | ORAL_TABLET | Freq: Every day | ORAL | Status: DC
Start: 2013-12-21 — End: 2014-03-02

## 2013-12-21 NOTE — Patient Instructions (Signed)
Cough, Adult   A cough is a reflex that helps clear your throat and airways. It can help heal the body or may be a reaction to an irritated airway. A cough may only last 2 or 3 weeks (acute) or may last more than 8 weeks (chronic).   CAUSES  Acute cough:   Viral or bacterial infections.  Chronic cough:   Infections.   Allergies.   Asthma.   Post-nasal drip.   Smoking.   Heartburn or acid reflux.   Some medicines.   Chronic lung problems (COPD).   Cancer.  SYMPTOMS    Cough.   Fever.   Chest pain.   Increased breathing rate.   High-pitched whistling sound when breathing (wheezing).   Colored mucus that you cough up (sputum).  TREATMENT    A bacterial cough may be treated with antibiotic medicine.   A viral cough must run its course and will not respond to antibiotics.   Your caregiver may recommend other treatments if you have a chronic cough.  HOME CARE INSTRUCTIONS    Only take over-the-counter or prescription medicines for pain, discomfort, or fever as directed by your caregiver. Use cough suppressants only as directed by your caregiver.   Use a cold steam vaporizer or humidifier in your bedroom or home to help loosen secretions.   Sleep in a semi-upright position if your cough is worse at night.   Rest as needed.   Stop smoking if you smoke.  SEEK IMMEDIATE MEDICAL CARE IF:    You have pus in your sputum.   Your cough starts to worsen.   You cannot control your cough with suppressants and are losing sleep.   You begin coughing up blood.   You have difficulty breathing.   You develop pain which is getting worse or is uncontrolled with medicine.   You have a fever.  MAKE SURE YOU:    Understand these instructions.   Will watch your condition.   Will get help right away if you are not doing well or get worse.  Document Released: 10/24/2010 Document Revised: 07/20/2011 Document Reviewed: 10/24/2010  [facility] Patient Information 2015 [place], [place]. This information is not intended  to replace advice given to you by your health care provider. Make sure you discuss any questions you have with your health care provider.  Strep Throat  Strep throat is an infection of the throat caused by a bacteria named Streptococcus pyogenes. Your health care provider may call the infection streptococcal "tonsillitis" or "pharyngitis" depending on whether there are signs of inflammation in the tonsils or back of the throat. Strep throat is most common in children aged 5-15 years during the cold months of the year, but it can occur in people of any age during any season. This infection is spread from person to person (contagious) through coughing, sneezing, or other close contact.  SIGNS AND SYMPTOMS    Fever or chills.   Painful, swollen, red tonsils or throat.   Pain or difficulty when swallowing.   White or yellow spots on the tonsils or throat.   Swollen, tender lymph nodes or "glands" of the neck or under the jaw.   Red rash all over the body (rare).  DIAGNOSIS   Many different infections can cause the same symptoms. A test must be done to confirm the diagnosis so the right treatment can be given. A "rapid strep test" can help your health care provider make the diagnosis in a few minutes. If this test  is not available, a light swab of the infected area can be used for a throat culture test. If a throat culture test is done, results are usually available in a day or two.  TREATMENT   Strep throat is treated with antibiotic medicine.  HOME CARE INSTRUCTIONS    Gargle with 1 tsp of salt in 1 cup of warm water, 3-4 times per day or as needed for comfort.   Family members who also have a sore throat or fever should be tested for strep throat and treated with antibiotics if they have the strep infection.   Make sure everyone in your household washes their hands well.   Do not share food, drinking cups, or personal items that could cause the infection to spread to others.   You may need to eat a soft  food diet until your sore throat gets better.   Drink enough water and fluids to keep your urine clear or pale yellow. This will help prevent dehydration.   Get plenty of rest.   Stay home from school, day care, or work until you have been on antibiotics for 24 hours.   Take medicines only as directed by your health care provider.   Take your antibiotic medicine as directed by your health care provider. Finish it even if you start to feel better.  SEEK MEDICAL CARE IF:    The glands in your neck continue to enlarge.   You develop a rash, cough, or earache.   You cough up green, yellow-brown, or bloody sputum.   You have pain or discomfort not controlled by medicines.   Your problems seem to be getting worse rather than better.   You have a fever.  SEEK IMMEDIATE MEDICAL CARE IF:    You develop any new symptoms such as vomiting, severe headache, stiff or painful neck, chest pain, shortness of breath, or trouble swallowing.   You develop severe throat pain, drooling, or changes in your voice.   You develop swelling of the neck, or the skin on the neck becomes red and tender.   You develop signs of dehydration, such as fatigue, dry mouth, and decreased urination.   You become increasingly sleepy, or you cannot wake up completely.  MAKE SURE YOU:   Understand these instructions.   Will watch your condition.   Will get help right away if you are not doing well or get worse.  Document Released: 04/24/2000 Document Revised: 09/11/2013 Document Reviewed: 06/26/2010  [facility] Patient Information 2015 [place], [place]. This information is not intended to replace advice given to you by your health care provider. Make sure you discuss any questions you have with your health care provider.     [facility] [employer]      Operated by [facility]  [address] [place], [place] [address]  Phone: [phone])  Fax: [phone]  [email or web address]  Open Daily 8:00a  - 8:00p    Closed Thanksgiving and Christmas Day  [facility]     Operated by [facility]  [address]  [facility]  [place], [place] [address]  Phone: [phone])  Fax: [phone]  [email or web address]  Open M-F  8:00a - 8:00p  Sat              10:00a - 4:00p  Sun             Closed  Closed all [place] Holidays  Attending Caregiver: Sudie Grumbling, PA-C      Today's orders:   Orders Placed This Encounter    Rapid Strep    Amoxicillin 500 mg Oral Tablet    Ibuprofen (MOTRIN) 800 mg Oral Tablet    albuterol sulfate (PROVENTIL OR VENTOLIN) 90 mcg/actuation Inhalation HFA Aerosol Inhaler    benzonatate (TESSALON) 100 mg Oral Capsule    predniSONE (DELTASONE) 50 mg Oral Tablet        Prescription(s) E-Rx to:  [id number] - [place], [place] - [address]    ________________________________________________________________________  Short Term Disability and Family Medical Leave Act  [facility] does NOT provide assistance with any disability applications.  If you feel your medical condition requires you to be on disability, you will need to follow up with  your primary care physician or a specialist.  We apologize for any inconvenience.    For Medication Prescribed by [facility]:  As an Urgent Care facility, our clinic does NOT offer prescription refills over the telephone.    If you need more of the medication one of our medical providers prescribed, you will  either need to be re-evaluated by [place] or see your primary care physician.    ________________________________________________________________________      It is very important that we have a phone number.  This is the single best way to contact you in the event that we become aware of important clinical information or concerns after your discharge.  If the phone number you provided at registration is NOT this number you should inform staff and registration prior to leaving.         Your treatment and evaluation today was focused on identifying and treating potentially emergent conditions based on your presenting signs, symptoms, and history.  The resulting initial clinical impression and treatment plan is not intended to be definitive or a substitute for a full physical examination and evaluation by your primary care provider.  If your symptoms persist, worsen, or you develop any new or concerning symptoms, you need to be evaluated.      If you received x-rays during your visit, be aware that the final and formal interpretation of those films by a radiologist may occur after your discharge.  If there is a significant discrepancy identified after your discharge, we will contact you at the telephone number provided at registration.      If you received a pelvic exam, you may have cultures pending for sexually transmitted diseases.  Positive cultures are reported to the [facility] Department of Health as required by state law.  You may contact the Health Information Management Office of [facility] to get a copy of your results.     If you are over 101 year old, we cannot discuss your personal health information with a parent, spouse, family member, or anyone else without your consent.  This does not include those who have legitimate access to your records and information to assist in your care under the provisions of HIPAA [facility] Portability and Accountability Act) law, or those to whom you have previously given written consent to do so, such a legal guardian or Power of [place].      Instructions are discussed with patient upon discharge by clinical staff with all questions answered.  Please call [facility] [phone]) if any further questions develop.  Go immediately to the emergency department if any concerns or worsening symptoms.      Sudie Grumbling, PA-C 12/21/2013,  18:31

## 2013-12-21 NOTE — Progress Notes (Addendum)
Attending: Dr.  Raelene BottMatela, Ramina Hulet, MD    History of Present Illness: Janet Kirby is a 23 y.o. female who presents to the Urgent Care today with chief complaint of    Chief Complaint    Sore Throat     Wheezing     Chest Congestion     Shortness of Breath           .   23 y.o. female presents today with chief complaint of sore throat for 1 day. Pt reports feeling normal yesterday and sxs appearing today and worsening throughout the day. She reports drinking after people last night. Associated signs and sxs cough, SOB with exertion, fever, trouble swallowing, chest congestion, HA. No Hx of asthma and patient reports smoking occasionally. Denies ear pain.    Location: throat  Quality: sore  Onset: 1 day  Severity: mild  Timing: continuing   Context: Pt reports feeling normal yesterday and sxs appearing today and worsening throughout the day.  Modifying factors: none  Associated symptoms: cough, SOB with exertion, fever, trouble swallowing, chest congestion, HA  Denies: ear pain     I reviewed and confirmed the patient's past medical history taken by the nurse or medical assistant with the addition of the following:    Past Medical History:    Past Medical History   Diagnosis Date    Scoliosis     Anxiety     Beta thalassemia      Past Surgical History:    Past Surgical History   Procedure Laterality Date    Hx wisdom teeth extraction       Allergies:  No Known Allergies  Medications:    Current Outpatient Prescriptions   Medication Sig    albuterol sulfate (PROVENTIL OR VENTOLIN) 90 mcg/actuation Inhalation HFA Aerosol Inhaler Take 1-2 Puffs by inhalation Every 6 hours as needed    Amoxicillin 500 mg Oral Tablet Take 1 Tab (500 mg total) by mouth Three times a day for 10 days Amoxicillin 500mg  3 times a day for 7 days    benzonatate (TESSALON) 100 mg Oral Capsule Take 1 Cap (100 mg total) by mouth Three times a day    Ibuprofen (MOTRIN) 800 mg Oral Tablet Take 1 Tab (800 mg total) by mouth Three times a day  as needed for Pain    predniSONE (DELTASONE) 50 mg Oral Tablet Take 1 Tab (50 mg total) by mouth Once a day     Social History:    History   Substance Use Topics    Smoking status: Never Smoker     Smokeless tobacco: Never Used    Alcohol Use: Yes      Comment: weekend binge drinker     Family History: No significant family history.  Family History:  Family History   Problem Relation Age of Onset    Healthy Mother     Healthy Father     Cancer Paternal Grandmother      throat- smoker    Cancer Paternal Grandfather      throat- smoker    Diabetes Neg Hx          Review of Systems:    General: fever  ENT:  sore throat, trouble swallowing, chest congestion, no ear pain   Pulmonary: cough, SOB with exertion  Neurologic:  headache  All other review of systems were negative    Physical Exam:  Vital signs:   Filed Vitals:    12/21/13 1753   BP: 157/94  Pulse: 99   Temp: 37.5 C (99.5 F)   TempSrc: Tympanic   Resp: 16   Height: 1.6 m (5\' 3" )   Weight: 70.3 kg (154 lb 15.7 oz)   SpO2: 100%     Body mass index is 27.46 kg/(m^2).  Patient's last menstrual period was 12/14/2013.    General:  No acute distress  Head:  Normocephalic  Eyes:  Normal lids/lashes and normal conjunctiva  ENT:  normal EAC's, normal TM's, MMM, normal pharynx/tonsils and normal tongue/uvula  Neck:  supple  Pulmonary:  clear to auscultation bilaterally, no wheezes, no rales and no rhonchi  Cardiovascular:  regular rate/rhythm  Skin:  warm/dry and no rash  Psychiatric:  Appropriate affect and behavior  Neurologic:   Alert and oriented x 3  Hem/Lymph:  Anterior CLAD     Data Reviewed:    Point-of-care testing:     Rapid Strep: Positive                      Course: Condition at discharge: Good     Differential Diagnosis: strep v pharyngitis v URI     Assessment:   1. Strep pharyngitis    2. Cough        Plan:    Orders Placed This Encounter    Rapid Strep    Amoxicillin 500 mg Oral Tablet    Ibuprofen (MOTRIN) 800 mg Oral Tablet    albuterol  sulfate (PROVENTIL OR VENTOLIN) 90 mcg/actuation Inhalation HFA Aerosol Inhaler    benzonatate (TESSALON) 100 mg Oral Capsule    predniSONE (DELTASONE) 50 mg Oral Tablet         Advised pt to take medication as directed. Return with new or worsening sx  Educated pt on proper hand washing and to avoid spreading illness.     Go to Emergency Department immediately for further work up if any concerning symptoms.  Plan was discussed and patient verbalized understanding.  If symptoms are worsening or not improving the patient should return to the Urgent Care for further evaluation.    I am scribing for, and in the presence of, Sudie Grumbling, PA-C, for services provided on 12/21/2013.  Blinda Leatherwood, SCRIBE     [place], SCRIBE 12/21/2013, 18:52      I personally performed the services described in this documentation, as scribed  in my presence, and it is both accurate  and complete.    Sudie Grumbling, PA-C    The supervising physician was physically present and available for consultation, and did not physically see the patient.    Sudie Grumbling, PA-C  12/21/2013, 18:52

## 2014-02-15 ENCOUNTER — Other Ambulatory Visit: Payer: Self-pay

## 2014-03-02 ENCOUNTER — Ambulatory Visit (INDEPENDENT_AMBULATORY_CARE_PROVIDER_SITE_OTHER)

## 2014-03-02 ENCOUNTER — Encounter (INDEPENDENT_AMBULATORY_CARE_PROVIDER_SITE_OTHER): Payer: Self-pay

## 2014-03-02 ENCOUNTER — Encounter (FREE_STANDING_LABORATORY_FACILITY): Admit: 2014-03-02 | Discharge: 2014-03-02 | Disposition: A | Discharge status: Home / Self Care

## 2014-03-02 VITALS — BP 125/73 | HR 83 | Temp 97.8°F | Resp 16 | Ht 64.0 in | Wt 152.8 lb

## 2014-03-02 DIAGNOSIS — Z202 Contact with and (suspected) exposure to infections with a predominantly sexual mode of transmission: Secondary | ICD-10-CM

## 2014-03-02 DIAGNOSIS — Z7251 High risk heterosexual behavior: Secondary | ICD-10-CM

## 2014-03-02 NOTE — Progress Notes (Addendum)
History of Present Illness: Janet Kirby is a 23 y.o. female who presents to the Urgent Care today with chief complaint of Vaginal Odor; Vaginal Itching; Vaginal Discharge; and Abdominal Cramping     Location: GU  Quality: itching  Onset: yesterday  Severity: moderate  Timing: constant  Context: Pt c/o vaginal odor, itching, and creamy/white discharge that started yesterday. Pt states h/o BV. Pt states that she has multiple heterosexual sex partners at current time, and believes that one of them may have herpes. Pt states she doesn't always use condoms with intercourse. Pt's LMP: 02/21/2014 and denies birth control use.  Treatment tried: none  Alleviating factors: none  Aggravating factors: itching  Pertinent Negatives: Pt denies fever, SOB, palpitations, N/V, back pain, vaginal burning or lesions  Associated symptoms: mild lower abd pain    I reviewed and confirmed the patient's past medical history taken by the nurse or medical assistant with the addition of the following:    Past Medical History:    Past Medical History   Diagnosis Date    Scoliosis     Anxiety     Beta thalassemia          Past Surgical History:    Past Surgical History   Procedure Laterality Date    Hx wisdom teeth extraction           Allergies:  No Known Allergies  Medications:    No current outpatient prescriptions on file.     Social History:    History   Substance Use Topics    Smoking status: Never Smoker     Smokeless tobacco: Never Used    Alcohol Use: Yes      Comment: weekend binge drinker     Family History: No significant family history.  Family History   Problem Relation Age of Onset    Healthy Mother     Healthy Father     Cancer Paternal Grandmother      throat- smoker    Cancer Paternal Grandfather      throat- smoker    Diabetes Neg Hx          Review of Systems:  General: no fever, no myalgias and no fatigue  Pulmonary:   no dry cough, no productive cough, no wheezing, no SOB and no hemoptysis  Cardiovascular:   no chest pain and no palpitations  Genitourinary:  no dysuria, no frequency, no urgency, no flank pain, LMP: 02/21/2014 and Pt c/o vaginal odor, itching, and creamy/white discharge that started yesterday. Pt states h/o BV. Pt states that she has multiple heterosexual sex partners at current time, and believes that one of them may have herpes. Pt states she doesn't always use condoms with intercourse. Pt's denies birth control use.  Skin:  no rash and denies any lesions or burning    Physical Exam:  Vital signs:   Filed Vitals:    03/02/14 1653   BP: 125/73   Pulse: 83   Temp: 36.6 C (97.8 F)   TempSrc: Tympanic   Resp: 16   Height: 1.626 m (5\' 4" )   Weight: 69.3 kg (152 lb 12.5 oz)   SpO2: 100%     General:  Well appearing  Pulmonary:  clear to auscultation bilaterally, no wheezes, no rales, no rhonchi and no retraction  Cardiovascular:  regular rate/rhythm, normal S1/S2 and no murmur/rub/gallop  Gastrointestinal:  Non-distended, normal bowel sounds, soft, non-tender, no hepatosplenomegaly, no rebound and no guarding  Genito-urinary:  no CVAT, female, normal  cervix, no cervical motion tenderness, no adnexal tenderness and on exam: cervix pink and intact, no internal lesions noted. Small amount of creamy discharge with odor noted.  Skin:  warm/dry, no rash and Small macullor papular lesion noted on perneium area @17 :00, no drainage noted    Data Reviewed  Point-of-care testing:  Time collected: 1737  Urine HCG: Negative  Leukocytes: (!) Moderate  Nitrite: Negative  Urobilinogen: Normal   Protein: Negative  pH: 6.0  Blood (urine): Moderate (2+)  Specific Gravity: 1.005  Ketones: Negative  Bilirubin: Negative  Glucose: Negative  Micro Sent: no  Culture Sent: yes  Urine Pregnancy: Negative  Rapid Trich: Negative  Bacterial Vag.: Negative                   and Labs: GC/ chlamydia/ herpes    Course: Condition at discharge: Good     Differential Diagnosis: STD vs UTI vs yeast infection    Assessment:   1. High risk  sexual behavior    2. Possible exposure to STD    3. Unprotected sex        Plan:    Orders Placed This Encounter    URINE CULTURE    NEISSERIA GONORRHOEAE DNA BY PCR    CHLAMYDIA TRACHOMITIS DNA BY PCR    Urine RACK without Microscopy    Urine Pregnancy    POCT BV (GARDNERELLA VAGINALAS)    POCT TRICHOMAS VAGINALAS     Obtained urine rack, urine preg(-), and urine culture  Obtained swabs for BV(-), trich(-), GC, and Chlamydia  Educated on safe sex     Plan was discussed and patient/parent/guardian verbalized understanding.  If symptoms are not improving the patient should return to the Urgent Care for further evaluation. If symptoms are worsening or emergent present to Emergency Department for  higher level of evaluation and care.  Supervising physician was physically present in Urgent Care and available for consultation and did not participate in the care of this patient.   Parke SimmersBeth M Nass, APRN 03/02/2014, 17:52

## 2014-03-02 NOTE — Patient Instructions (Signed)
Safe Sex  Safe sex is about reducing the risk of giving or getting a sexually transmitted disease (STD). STDs are spread through sexual contact involving the genitals, mouth, or rectum. Some STDs can be cured and others cannot. Safe sex can also prevent unintended pregnancies.   WHAT ARE SOME SAFE SEX PRACTICES?   Limit your sexual activity to only one partner who is having sex with only you.   Talk to your partner about his or her past partners, past STDs, and drug use.   Use a condom every time you have sexual intercourse. This includes vaginal, oral, and anal sexual activity. Both females and males should wear condoms during oral sex. Only use latex or polyurethane condoms and water-based lubricants. Using petroleum-based lubricants or oils to lubricate a condom will weaken the condom and increase the chance that it will break. The condom should be in place from the beginning to the end of sexual activity. Wearing a condom reduces, but does not completely eliminate, your risk of getting or giving an STD. STDs can be spread by contact with infected body fluids and skin.   Get vaccinated for hepatitis B and HPV.   Avoid alcohol and recreational drugs, which can affect your judgment. You may forget to use a condom or participate in high-risk sex.   For females, avoid douching after sexual intercourse. Douching can spread an infection farther into the reproductive tract.   Check your body for signs of sores, blisters, rashes, or unusual discharge. See your health care provider if you notice any of these signs.   Avoid sexual contact if you have symptoms of an infection or are being treated for an STD. If you or your partner has herpes, avoid sexual contact when blisters are present. Use condoms at all other times.   If you are at risk of being infected with HIV, it is recommended that you take a prescription medicine daily to prevent HIV infection. This is called pre-exposure prophylaxis (PrEP). You are  considered at risk if:   You are a man who has sex with other men (MSM).   You are a heterosexual man or woman who is sexually active with more than one partner.   You take drugs by injection.   You are sexually active with a partner who has HIV.   Talk with your health care provider about whether you are at high risk of being infected with HIV. If you choose to begin PrEP, you should first be tested for HIV. You should then be tested every 3 months for as long as you are taking PrEP.   See your health care provider for regular screenings, exams, and tests for other STDs. Before having sex with a new partner, each of you should be screened for STDs and should talk about the results with each other.  WHAT ARE THE BENEFITS OF SAFE SEX?    There is less chance of getting or giving an STD.   You can prevent unwanted or unintended pregnancies.   By discussing safe sex concerns with your partner, you may increase feelings of intimacy, comfort, trust, and honesty between the two of you.  Document Released: 06/04/2004 Document Revised: 09/11/2013 Document Reviewed: 10/19/2011  [facility] Patient Information 2015 [place], [place]. This information is not intended to replace advice given to you by your health care provider. Make sure you discuss any questions you have with your health care provider.  Sexually Transmitted Disease  A sexually transmitted disease (STD) is a disease  or infection often passed to another person during sex. However, STDs can be passed through nonsexual ways. An STD can be passed through:   Spit (saliva).   Semen.   Blood.   Mucus from the vagina.   Pee (urine).  HOW CAN I LESSEN MY CHANCES OF GETTING AN STD?   Use:   Latex condoms.   Water-soluble lubricants with condoms. Do not use petroleum jelly or oils.   Dental dams. These are small pieces of latex that are used as a barrier during oral sex.   Avoid having more than one sex partner.   Do not have sex with someone who has other  sex partners.   Do not have sex with anyone you do not know or who is at high risk for an STD.   Avoid risky sex that can break your skin.   Do not have sex if you have open sores on your mouth or skin.   Avoid drinking too much alcohol or taking illegal drugs. Alcohol and drugs can affect your good judgment.   Avoid oral and anal sex acts.   Get shots (vaccines) for HPV and hepatitis.   If you are at risk of being infected with HIV, it is advised that you take a certain medicine daily to prevent HIV infection. This is called pre-exposure prophylaxis (PrEP). You may be at risk if:   You are a man who has sex with other men (MSM).   You are attracted to the opposite sex (heterosexual) and are having sex with more than one partner.   You take drugs with a needle.   You have sex with someone who has HIV.   Talk with your doctor about if you are at high risk of being infected with HIV. If you begin to take PrEP, get tested for HIV first. Get tested every 3 months for as long as you are taking PrEP.  WHAT SHOULD I DO IF I THINK I HAVE AN STD?   See your doctor.   Tell your sex partner(s) that you have an STD. They should be tested and treated.   Do not have sex until your doctor says it is okay.  WHEN SHOULD I GET HELP?  Get help right away if:   You have bad belly (abdominal) pain.   You are a man and have puffiness (swelling) or pain in your testicles.   You are a woman and have puffiness in your vagina.  Document Released: 06/04/2004 Document Revised: 05/02/2013 Document Reviewed: 10/21/2012  [facility] Patient Information 2015 [place]. This information is not intended to replace advice given to you by your health care provider. Make sure you discuss any questions you have with your health care provider.     [facility]      Operated by [facility]  [address]  [place], [place] [address]  Phone: [phone]  Fax:  [phone]  [email or web address]  Open Daily 8:00a - 8:00p    Closed Thanksgiving and Christmas Day  [facility]     Operated by [facility]  [address]  [facility]  [place], [place] [id number]  Phone: [phone]  Fax: [phone]  [email or web address]  Open M-F  8:00a - 8:00p  Sat              10:00a - 4:00p  Sun             Closed  Closed all  [facility] Holidays        Attending Caregiver: Parke SimmersBeth M Nass, APRN      Today's orders:   Orders Placed This Encounter    URINE CULTURE    NEISSERIA GONORRHOEAE DNA BY PCR    CHLAMYDIA TRACHOMITIS DNA BY PCR    Urine RACK without Microscopy    Urine Pregnancy    POCT BV (GARDNERELLA VAGINALAS)    POCT TRICHOMAS VAGINALAS        Prescription(s) E-Rx to:  [id number] - [place], [place] - [address]    ________________________________________________________________________  Short Term Disability and Family Medical Leave Act  [facility] does NOT provide assistance with any disability applications.  If you feel your medical condition requires you to be on disability, you will need to follow up with  your primary care physician or a specialist.  We apologize for any inconvenience.    For Medication Prescribed by [facility]:  As an Urgent Care facility, our clinic does NOT offer prescription refills over the telephone.    If you need more of the medication one of our medical providers prescribed, you will  either need to be re-evaluated by us or see your primary care physician.    ________________________________________________________________________      It is very important that we have a phone number.  This is the single best way to contact you in the event that we become aware of important clinical information or concerns after your discharge.  If the phone number you provided at registration is NOT this number you should inform staff and registration prior to leaving.      Your treatment and  evaluation today was focused on identifying and treating potentially emergent conditions based on your presenting signs, symptoms, and history.  The resulting initial clinical impression and treatment plan is not intended to be definitive or a substitute for a full physical examination and evaluation by your primary care provider.  If your symptoms persist, worsen, or you develop any new or concerning symptoms, you need to be evaluated.      If you received x-rays during your visit, be aware that the final and formal interpretation of those films by a radiologist may occur after your discharge.  If there is a significant discrepancy identified after your discharge, we will contact you at the telephone number provided at registration.      If you received a pelvic exam, you may have cultures pending for sexually transmitted diseases.  Positive cultures are reported to the [facility] Department of Health as required by state law.  You may contact the Health Information Management Office of [facility] to get a copy of your results.     If you are over 230 year old, we cannot discuss your personal health information with a parent, spouse, family member, or anyone else without your consent.  This does not include those who have legitimate access to your records and information to assist in your care under the provisions of HIPAA [facility](Health Insurance Portability and Accountability Act) law, or those to whom you have previously given written consent to do so, such a legal guardian or Power of Be[place]Attorney.      Instructions are discussed with patient upon discharge by clinical staff with all questions answered.  Please call [facility] [phone]) if any further questions develop.  Go immediately to the emergency department if any concerns or worsening symptoms.      Parke SimmersBeth M Nass, APRN  03/02/2014, 17:36

## 2014-03-05 LAB — URINE CULTURE: CULTURE OBSERVATION: 50000 — AB

## 2014-03-06 ENCOUNTER — Other Ambulatory Visit (INDEPENDENT_AMBULATORY_CARE_PROVIDER_SITE_OTHER): Payer: Self-pay | Admitting: Family

## 2014-03-06 LAB — NEISSERIA GONORRHOEAE DNA BY PCR

## 2014-03-06 LAB — CHLAMYDIA TRACHOMITIS DNA BY PCR (INHOUSE)

## 2014-03-07 ENCOUNTER — Encounter (FREE_STANDING_LABORATORY_FACILITY)
Admit: 2014-03-07 | Discharge: 2014-03-07 | Disposition: A | Discharge status: Home / Self Care | Attending: Family | Admitting: Family

## 2014-03-07 ENCOUNTER — Other Ambulatory Visit (INDEPENDENT_AMBULATORY_CARE_PROVIDER_SITE_OTHER): Payer: Self-pay

## 2014-03-07 DIAGNOSIS — Z202 Contact with and (suspected) exposure to infections with a predominantly sexual mode of transmission: Secondary | ICD-10-CM

## 2014-03-08 LAB — NEISSERIA GONORRHOEAE DNA BY PCR

## 2014-03-08 LAB — CHLAMYDIA TRACHOMITIS DNA BY PCR (INHOUSE)

## 2014-06-07 ENCOUNTER — Ambulatory Visit (INDEPENDENT_AMBULATORY_CARE_PROVIDER_SITE_OTHER)

## 2014-06-07 ENCOUNTER — Encounter (INDEPENDENT_AMBULATORY_CARE_PROVIDER_SITE_OTHER): Payer: Self-pay

## 2014-06-07 VITALS — BP 135/81 | HR 86 | Temp 98.0°F | Resp 16 | Ht 64.0 in | Wt 159.2 lb

## 2014-06-07 DIAGNOSIS — B86 Scabies: Secondary | ICD-10-CM

## 2014-06-07 MED ORDER — PERMETHRIN 5 % TOPICAL CREAM
TOPICAL_CREAM | Freq: Once | CUTANEOUS | Status: AC
Start: 2014-06-07 — End: 2014-06-07

## 2014-06-07 NOTE — Progress Notes (Signed)
I personally performed the services described in this documentation, as scribed  in my presence, and it is both accurate  and complete.    Kumar Falwell E Ulric Salzman, MD

## 2014-06-07 NOTE — Progress Notes (Signed)
History of Present Illness: Janet Kirby is a 24 y.o. female who presents to the Urgent Care today with chief complaint of    Chief Complaint     SCABIES           .     Location: generalized  Quality: rash  Onset: 3 weeks  Severity: mild  Timing: continuing  Context: Known exposure to scabies through roommates.   Modifying factors: none  Associated symptoms: itching    I reviewed and confirmed the patient's past medical history taken by the nurse or medical assistant with the addition of the following:    Past Medical History:    Past Medical History   Diagnosis Date    Scoliosis     Anxiety     Beta thalassemia          Past Surgical History:    Past Surgical History   Procedure Laterality Date    Hx wisdom teeth extraction           Allergies:  No Known Allergies  Medications:    Current Outpatient Prescriptions   Medication Sig    permethrin (ELIMITE) 5 % Cream Apply topically One time for 1 dose Cover entire body (all bare skin) and then wash in 10-12 hours. Can repeat in 14 days prn.     Social History:    History   Substance Use Topics    Smoking status: Never Smoker     Smokeless tobacco: Never Used    Alcohol Use: Yes      Comment: weekend binge drinker     Family History: No significant family history.  Family History   Problem Relation Age of Onset    Healthy Mother     Healthy Father     Cancer Paternal Grandmother      throat- smoker    Cancer Paternal Grandfather      throat- smoker    Diabetes Neg Hx          Review of Systems:    Skin:  generalized rash with itching   All other review of systems were negative    Physical Exam:  Vital signs:   Filed Vitals:    06/07/14 1931   BP: 135/81   Pulse: 86   Temp: 36.7 C (98 F)   TempSrc: Tympanic   Resp: 16   Height: 1.626 m (5\' 4" )   Weight: 72.2 kg (159 lb 2.8 oz)   SpO2: 99%     Body mass index is 27.31 kg/(m^2). Normalized weight-for-age data available only for age 67 to 20 years.  Patient's last menstrual period was  05/14/2014.    General:  No acute distress  Skin: Excoriated lesions over left wrist, elbow, right axilla and ankles  Psychiatric:  Appropriate affect and behavior  Neurologic:   Alert and oriented x 3    Data Reviewed:    Not applicable    Course: Condition at discharge: Good     Differential Diagnosis: scabies v contact dermatitis     Assessment:   1. Scabies        Plan:    Orders Placed This Encounter    permethrin (ELIMITE) 5 % Cream        Apply cream as directed.     Go to Emergency Department immediately for further work up if any concerning symptoms.  Plan was discussed and patient verbalized understanding.  If symptoms are worsening or not improving the patient should return to the Urgent  Care for further evaluation.    I am scribing for, and in the presence of, Dr. Chriss Driver, MD for services provided on 06/07/2014.  Blinda Leatherwood, SCRIBE     [place], SCRIBE 06/07/2014, 19:39

## 2014-06-07 NOTE — Patient Instructions (Signed)
[facility]      Operated by [facility]  [address] [place], [place] [address]  Phone: [phone])  Fax: [phone]  [email or web address]  Open Daily 8:00a - 8:00p    Closed Thanksgiving and Christmas Day  [facility]     Operated by [facility]  [address], [place] [address]  Phone: [phone])  Fax: [phone]  [email or web address]  Open M-F  8:00a - 8:00p  Sat              10:00a - 4:00p  Sun             Closed  Closed all [place] Holidays        Attending Caregiver: Roney Jaffe, MD      Today's orders:   Orders Placed This Encounter    permethrin (ELIMITE) 5 % Cream        Prescription(s) E-Rx to:  [id number] - [place], [place]    ________________________________________________________________________  Short Term Disability and [facility] does NOT provide assistance with any disability applications.  If you feel your medical condition requires you to be on disability, you will need to follow up with  your primary care physician or a specialist.  We apologize for any inconvenience.    For Medication Prescribed by [facility]:  As an Urgent Care facility, our clinic does NOT offer prescription refills over the telephone.    If you need more of the medication one of our medical providers prescribed, you will  either need to be re-evaluated by [place] or see your primary care physician.    ________________________________________________________________________      It is very important that we have a phone number.  This is the single best way to contact you in the event that we become aware of important clinical information or concerns after your discharge.  If the phone number you provided at registration is NOT this number you should inform staff and registration prior to leaving.      Your treatment and  evaluation today was focused on identifying and treating potentially emergent conditions based on your presenting signs, symptoms, and history.  The resulting initial clinical impression and treatment plan is not intended to be definitive or a substitute for a full physical examination and evaluation by your primary care provider.  If your symptoms persist, worsen, or you develop any new or concerning symptoms, you need to be evaluated.      If you received x-rays during your visit, be aware that the final and formal interpretation of those films by a radiologist may occur after your discharge.  If there is a significant discrepancy identified after your discharge, we will contact you at the telephone number provided at registration.      If you received a pelvic exam, you may have cultures pending for sexually transmitted diseases.  Positive cultures are reported to the [place] Department of Health as required by state law.  You may contact the Health Information Management Office of [facility] to get a copy of your results.     If you are over 49 year old, we cannot discuss your personal health information with a parent, spouse, family member, or anyone else without your consent.  This does not include those who have legitimate access to your records and information to assist in  your care under the provisions of HIPAA [facility](Health Insurance Portability and Accountability Act) law, or those to whom you have previously given written consent to do so, such a legal guardian or Power of [place].      Instructions are discussed with patient upon discharge by clinical staff with all questions answered.  Please call [facility] [phone]) if any further questions develop.  Go immediately to the emergency department if any concerns or worsening symptoms.      Janifer AdieMark E Tanveer Brammer, MD 06/07/2014, 19:46      Scabies  Scabies are small bugs (mites) that burrow under the skin and cause red bumps and severe itching. These  bugs can only be seen with a microscope. Scabies are highly contagious. They can spread easily from person to person by direct contact. They are also spread through sharing clothing or linens that have the scabies mites living in them. It is not unusual for an entire family to become infected through shared towels, clothing, or bedding.   HOME CARE INSTRUCTIONS    Your caregiver may prescribe a cream or lotion to kill the mites. If cream is prescribed, massage the cream into the entire body from the neck to the bottom of both feet. Also massage the cream into the scalp and face if your child is less than 22 year old. Avoid the eyes and mouth. Do not wash your hands after application.   Leave the cream on for 8 to 12 hours. Your child should bathe or shower after the 8 to 12 hour application period. Sometimes it is helpful to apply the cream to your child right before bedtime.   Keep fingernails closely trimmed to reduce injury from scratching.   One treatment is usually effective and will eliminate approximately 95% of infestations. For severe cases, your caregiver may decide to repeat the treatment in 1 week. Everyone in your household should be treated with one application of the cream.   New rashes or burrows should not appear within 24 to 48 hours after successful treatment. However, the itching and rash may last for 2 to 4 weeks after successful treatment. Your caregiver may prescribe a medicine to help with the itching or to help the rash go away more quickly.   Scabies can live on clothing or linens for up to 3 days. All of your child's recently used clothing, towels, stuffed toys, and bed linens should be washed in hot water and then dried in a dryer for at least 20 minutes on high heat. Items that cannot be washed should be enclosed in a plastic bag for at least 3 days.   To help relieve itching, bathe your child in a cool bath or apply cool washcloths to the affected areas.   Your child may return  to school after treatment with the prescribed cream.  SEEK MEDICAL CARE IF:    The itching persists longer than 4 weeks after treatment.   The rash spreads or becomes infected. Signs of infection include red blisters or yellow-tan crust.     This information is not intended to replace advice given to you by your health care provider. Make sure you discuss any questions you have with your health care provider.     Document Released: 04/27/2005 Document Revised: 02/13/2014 Document Reviewed: 09/05/2008  [facility] Patient Information 2015 [employer] [place].

## 2014-06-08 NOTE — Addendum Note (Signed)
Addended by: Debbra RidingBORING PARK, Davinder Haff ELAINE on: 06/08/2014 08:30 AM     Modules accepted: Orders

## 2015-04-14 ENCOUNTER — Other Ambulatory Visit: Payer: Self-pay

## 2016-02-09 ENCOUNTER — Other Ambulatory Visit: Payer: Self-pay

## 2017-10-23 ENCOUNTER — Emergency Department (HOSPITAL_COMMUNITY)
Admission: EM | Admit: 2017-10-23 | Discharge: 2017-10-23 | Disposition: A | Discharge status: Home / Self Care | Attending: Emergency Medicine | Admitting: Emergency Medicine

## 2017-10-23 ENCOUNTER — Other Ambulatory Visit: Payer: Self-pay

## 2017-10-23 ENCOUNTER — Emergency Department (HOSPITAL_COMMUNITY)

## 2017-10-23 ENCOUNTER — Encounter (HOSPITAL_COMMUNITY): Payer: Self-pay

## 2017-10-23 DIAGNOSIS — Y908 Blood alcohol level of 240 mg/100 ml or more: Secondary | ICD-10-CM | POA: Diagnosis not present

## 2017-10-23 DIAGNOSIS — F10920 Alcohol use, unspecified with intoxication, uncomplicated: Secondary | ICD-10-CM | POA: Diagnosis not present

## 2017-10-23 DIAGNOSIS — F17210 Nicotine dependence, cigarettes, uncomplicated: Secondary | ICD-10-CM | POA: Diagnosis not present

## 2017-10-23 DIAGNOSIS — R4182 Altered mental status, unspecified: Secondary | ICD-10-CM | POA: Diagnosis present

## 2017-10-23 LAB — CBC WITH DIFFERENTIAL/PLATELET
BASOS ABS: 0 10*3/uL (ref 0.0–0.1)
Basophils Relative: 0 %
EOS ABS: 0.1 10*3/uL (ref 0.0–0.7)
Eosinophils Relative: 1 %
HCT: 34.1 % — ABNORMAL LOW (ref 36.0–46.0)
HEMOGLOBIN: 11 g/dL — AB (ref 12.0–15.0)
LYMPHS PCT: 48 %
Lymphs Abs: 2.6 10*3/uL (ref 0.7–4.0)
MCH: 20.6 pg — ABNORMAL LOW (ref 26.0–34.0)
MCHC: 32.3 g/dL (ref 30.0–36.0)
MCV: 63.7 fL — ABNORMAL LOW (ref 78.0–100.0)
Monocytes Absolute: 0.3 10*3/uL (ref 0.1–1.0)
Monocytes Relative: 6 %
NEUTROS PCT: 45 %
Neutro Abs: 2.5 10*3/uL (ref 1.7–7.7)
Platelets: 242 10*3/uL (ref 150–400)
RBC: 5.35 MIL/uL — ABNORMAL HIGH (ref 3.87–5.11)
RDW: 14.6 % (ref 11.5–15.5)
WBC: 5.5 10*3/uL (ref 4.0–10.5)

## 2017-10-23 LAB — COMPREHENSIVE METABOLIC PANEL
ALK PHOS: 32 U/L — AB (ref 38–126)
ALT: 25 U/L (ref 14–54)
AST: 23 U/L (ref 15–41)
Albumin: 4 g/dL (ref 3.5–5.0)
Anion gap: 8 (ref 5–15)
BUN: 8 mg/dL (ref 6–20)
CALCIUM: 8.3 mg/dL — AB (ref 8.9–10.3)
CO2: 24 mmol/L (ref 22–32)
Chloride: 110 mmol/L (ref 101–111)
Creatinine, Ser: 0.5 mg/dL (ref 0.44–1.00)
GFR calc Af Amer: >60 mL/min (ref >60)
GFR calc non Af Amer: >60 mL/min (ref >60)
GLUCOSE: 108 mg/dL — AB (ref 65–99)
Potassium: 3.6 mmol/L (ref 3.5–5.1)
SODIUM: 142 mmol/L (ref 135–145)
Total Bilirubin: 0.6 mg/dL (ref 0.3–1.2)
Total Protein: 7.2 g/dL (ref 6.5–8.1)

## 2017-10-23 LAB — ETHANOL: Alcohol, Ethyl (B): 244 mg/dL — ABNORMAL HIGH (ref <10)

## 2017-10-23 LAB — ACETAMINOPHEN LEVEL

## 2017-10-23 LAB — SALICYLATE LEVEL: Salicylate Lvl: <7 mg/dL (ref 2.8–30.0)

## 2017-10-23 LAB — CBG MONITORING, ED: Glucose-Capillary: 103 mg/dL — ABNORMAL HIGH (ref 65–99)

## 2017-10-23 NOTE — ED Notes (Addendum)
PA at bedside.

## 2017-10-23 NOTE — ED Notes (Signed)
Pt back from CT

## 2017-10-23 NOTE — ED Triage Notes (Signed)
[facility] found outside [employer], she had been there drinking with her friends Pt has an abrasion on her left knee  Pt responds to strong pain

## 2017-10-23 NOTE — ED Provider Notes (Signed)
[facility]-EMERGENCY DEPT Provider Note   CSN: [id number] Arrival date & time: 10/23/17  0240     History   Chief Complaint Chief Complaint  Patient presents with  . Alcohol Intoxication    HPI Jill Meyer is a 27 y.o. female.  Patient presents to the emergency department with a chief complaint of altered mental status.  She was found outside of a pub lying on the ground.  She had been drinking a lot of alcohol tonight, but may have also taken unknown drugs.  Level 5 caveat applies secondary to altered status.  The history is provided by the patient. No language interpreter was used.    History reviewed. No pertinent past medical history.  There are no active problems to display for this patient.   History reviewed. No pertinent surgical history.   OB History   None      Home Medications    Prior to Admission medications   Not on File    Family History History reviewed. No pertinent family history.  Social History Social History   Tobacco Use  . Smoking status: Current Every Day Smoker  . Smokeless tobacco: Never Used  Substance Use Topics  . Alcohol use: Yes    Frequency: Never  . Drug use: Never     Allergies   Patient has no allergy information on record.   Review of Systems Review of Systems  Unable to perform ROS: Mental status change     Physical Exam Updated Vital Signs LMP  (LMP Unknown)   Physical Exam  Constitutional: She appears well-developed and well-nourished.  HENT:  Head: Normocephalic and atraumatic.  Eyes: Pupils are equal, round, and reactive to light. Conjunctivae and EOM are normal.  Neck: Normal range of motion. Neck supple.  Cardiovascular: Normal rate and regular rhythm. Exam reveals no gallop and no friction rub.  No murmur heard. Pulmonary/Chest: Effort normal and breath sounds normal. No respiratory distress. She has no wheezes. She has no rales. She exhibits no tenderness.    Abdominal: Soft. Bowel sounds are normal. She exhibits no distension and no mass. There is no tenderness. There is no rebound and no guarding.  Musculoskeletal: Normal range of motion. She exhibits no edema or tenderness.  Skin: Skin is warm and dry.  Psychiatric: She has a normal mood and affect. Her behavior is normal. Judgment and thought content normal.  Nursing note and vitals reviewed.    ED Treatments / Results  Labs (all labs ordered are listed, but only abnormal results are displayed) Labs Reviewed  ETHANOL - Abnormal; Notable for the following components:      Result Value   Alcohol, Ethyl (B) 244 (*)    All other components within normal limits  ACETAMINOPHEN LEVEL - Abnormal; Notable for the following components:   Acetaminophen (Tylenol), Serum <10 (*)    All other components within normal limits  CBC WITH DIFFERENTIAL/PLATELET - Abnormal; Notable for the following components:   RBC 5.35 (*)    Hemoglobin 11.0 (*)    HCT 34.1 (*)    MCV 63.7 (*)    MCH 20.6 (*)    All other components within normal limits  COMPREHENSIVE METABOLIC PANEL - Abnormal; Notable for the following components:   Glucose, Bld 108 (*)    Calcium 8.3 (*)    Alkaline Phosphatase 32 (*)    All other components within normal limits  CBG MONITORING, ED - Abnormal; Notable for the following components:   Glucose-Capillary 103 (*)  All other components within normal limits  SALICYLATE LEVEL    EKG None  Radiology Ct Head Wo Contrast  Result Date: 10/23/2017 CLINICAL DATA:  Altered level of consciousness. EXAM: CT HEAD WITHOUT CONTRAST TECHNIQUE: Contiguous axial images were obtained from the base of the skull through the vertex without intravenous contrast. COMPARISON:  None. FINDINGS: Brain: No intracranial hemorrhage, mass effect, or midline shift. No hydrocephalus. The basilar cisterns are patent. No evidence of territorial infarct or acute ischemia. No extra-axial or intracranial fluid  collection. Vascular: No hyperdense vessel or unexpected calcification. Skull: No fracture or focal lesion. Sinuses/Orbits: Paranasal sinuses and mastoid air cells are clear. The visualized orbits are unremarkable. Other: None. IMPRESSION: Negative head CT. Electronically Signed   By: Rubye Oaks M.D.   On: 10/23/2017 03:56    Procedures Procedures (including critical care time)  Medications Ordered in ED Medications - No data to display   Initial Impression / Assessment and Plan / ED Course  I have reviewed the triage vital signs and the nursing notes.  Pertinent labs & imaging results that were available during my care of the patient were reviewed by me and considered in my medical decision making (see chart for details).     Patient is intoxicated, and is unresponsive, but is protecting her airway and VSS.  Labs and CT of head are reassuring.  Ethanol is high, and thought to be the cause of her AMS.    5:25 AM Patient reassessed.  She awakens to voice.  She is alert and oriented.  She has no complaints.  A friend is bedside, and is agreeable with taking the patient home.  Final Clinical Impressions(s) / ED Diagnoses   Final diagnoses:  Alcoholic intoxication without complication [facility])    ED Discharge Orders    None       Roxy Horseman, PA-C 10/23/17 0525    Gilda Crease, MD 10/23/17 [phone]

## 2019-06-10 IMAGING — CT CT HEAD W/O CM
4 of 5 series · 16 of 47 positions shown, 18 images · non-contrast
Comparison: None.

CLINICAL DATA: Altered level of consciousness.

EXAM:
CT HEAD WITHOUT CONTRAST
TECHNIQUE: Contiguous axial images were obtained from the base of the skull
through the vertex without intravenous contrast.

[Series 4: head wo · axial · 0.47mm/px · z∈[-80,-35]mm · 3 of 32 slices shown]
[im 5/32  brain]
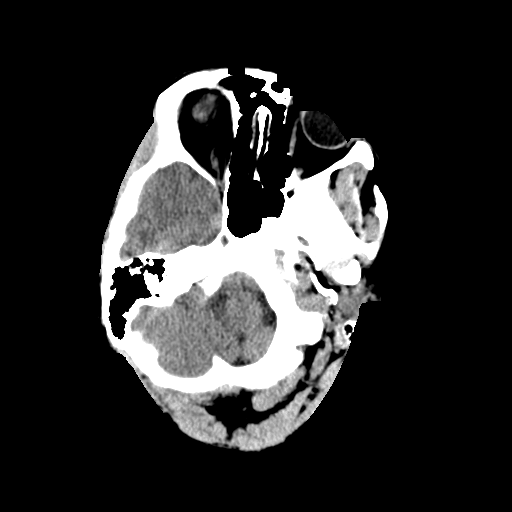
[im 9/32  brain]
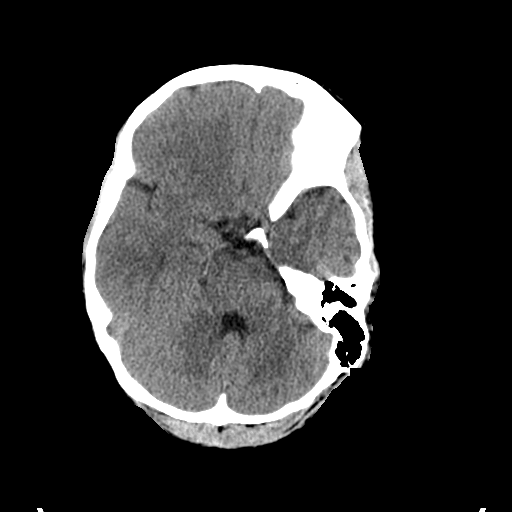
[im 14/32  brain]
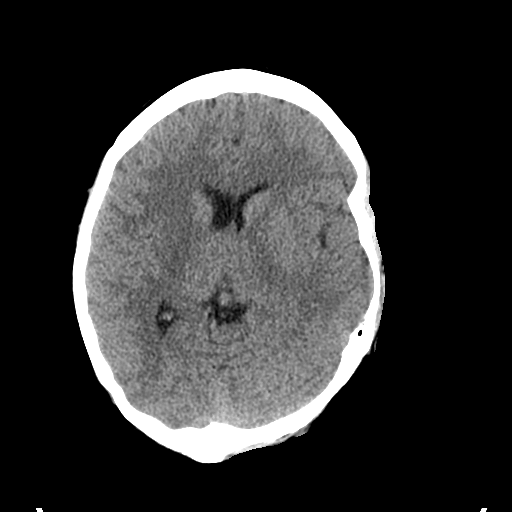

[Series 5: coronal soft tissue · coronal · 0.35mm/px · 3 of 70 slices shown]
[im 24/70  brain]
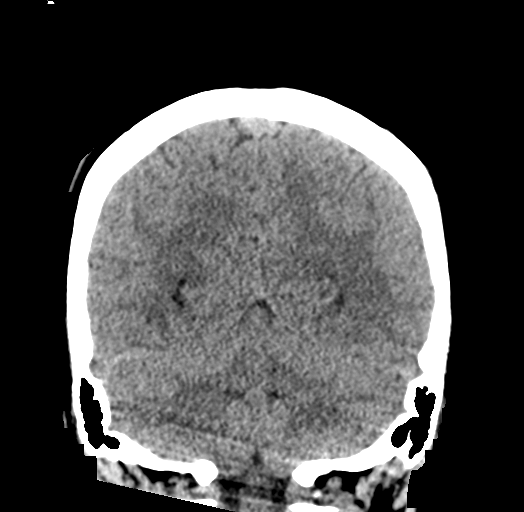
[im 31/70  brain]
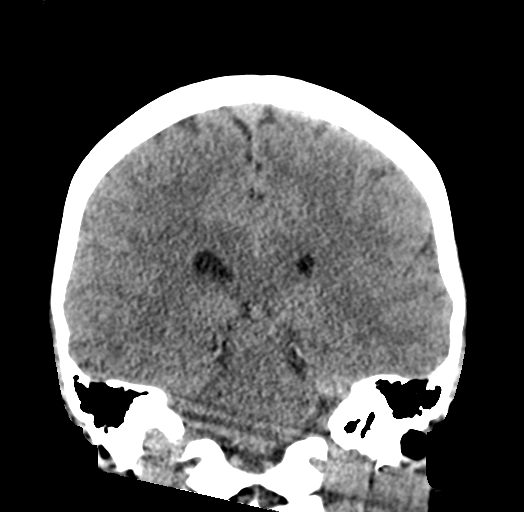
[im 39/70  brain]
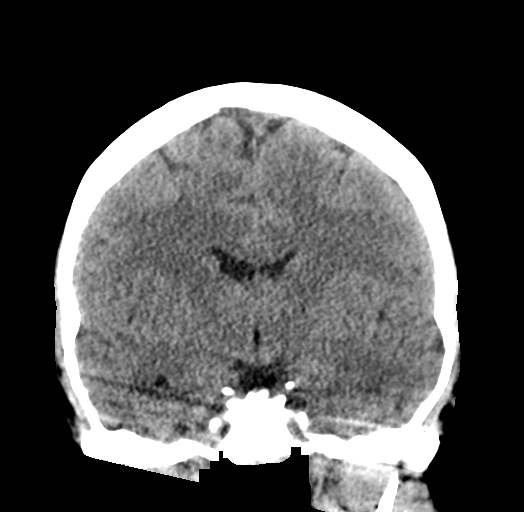

[Series 6: sagittal soft tissue · sagittal · 0.35mm/px · 3 of 60 slices shown]
[im 25/60  brain]
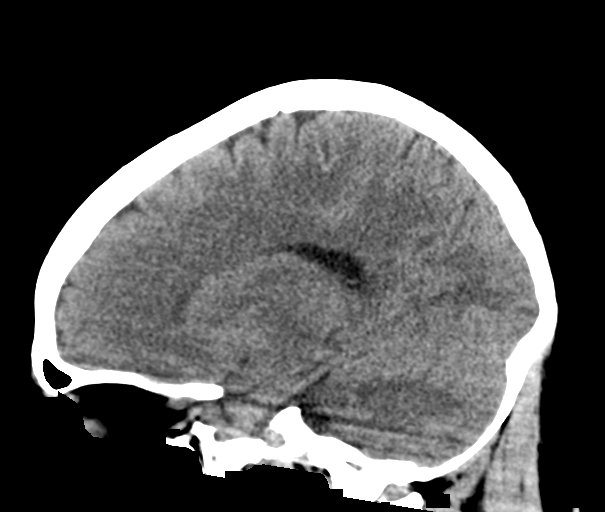
[im 31/60  brain]
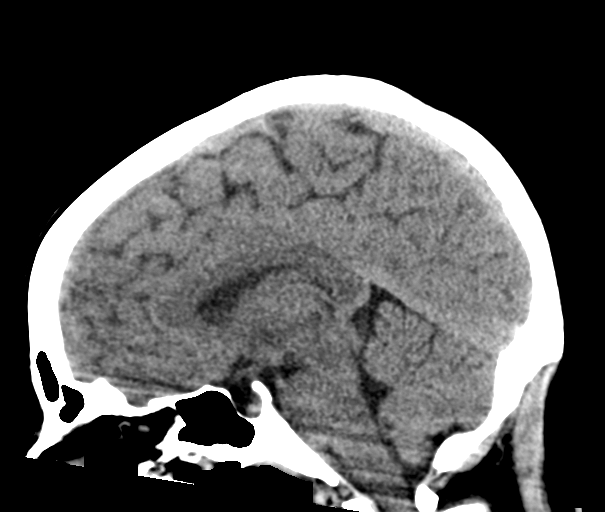
[im 36/60  brain]
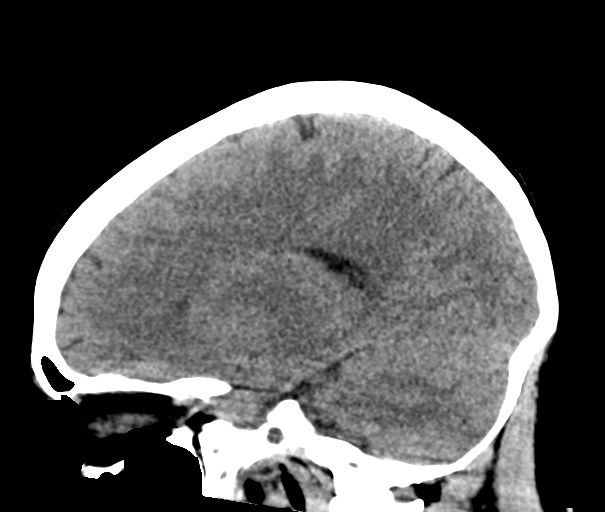

[Series 8: ax head wo · axial · 0.32mm/px · z∈[-157,-30]mm · 7 of 36 slices shown, 9 images]
[im 5/36  brain]
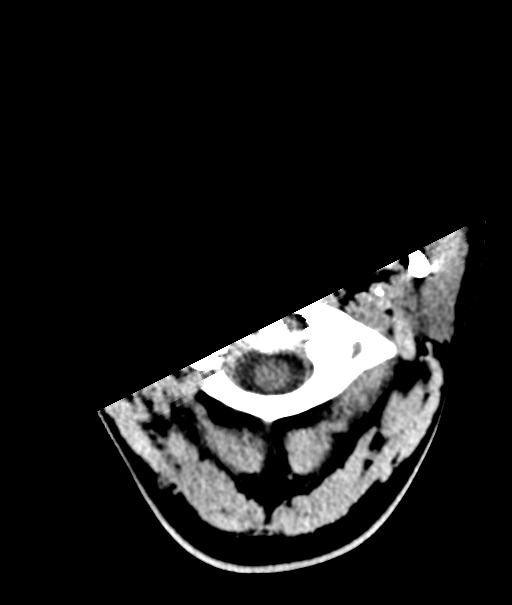
[im 5/36  bone]
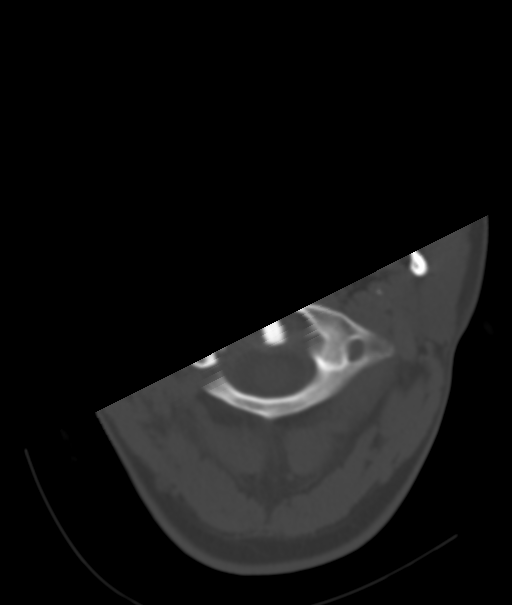
[im 9/36  brain]
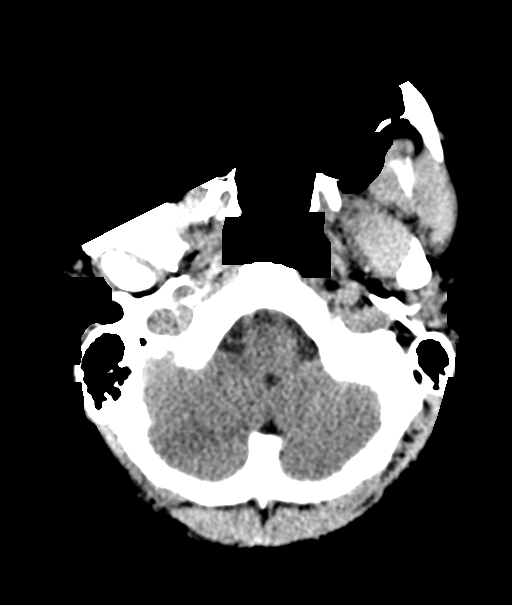
[im 14/36  brain]
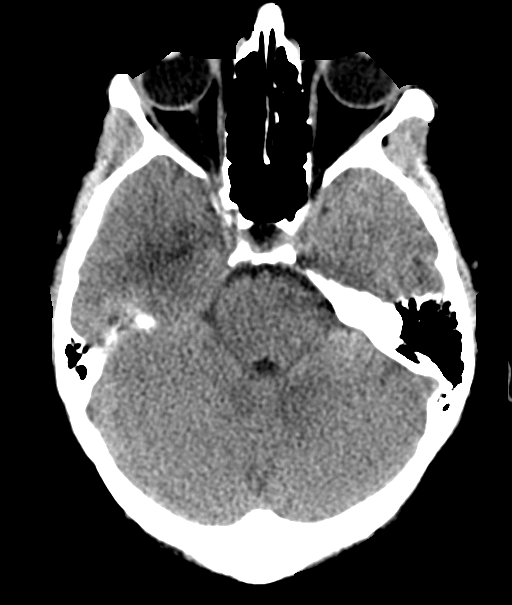
[im 18/36  brain]
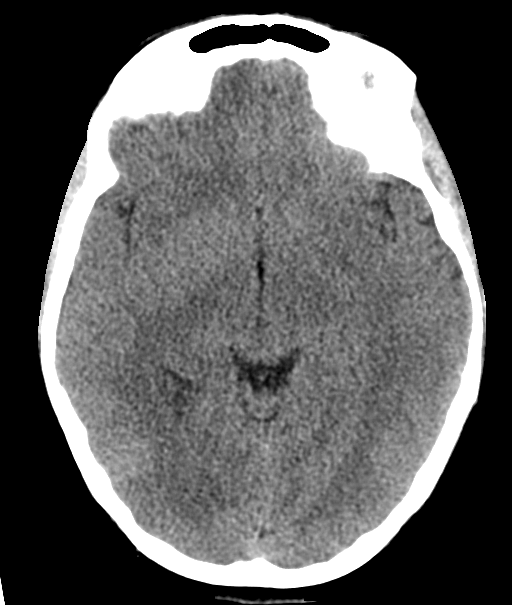
[im 22/36  brain]
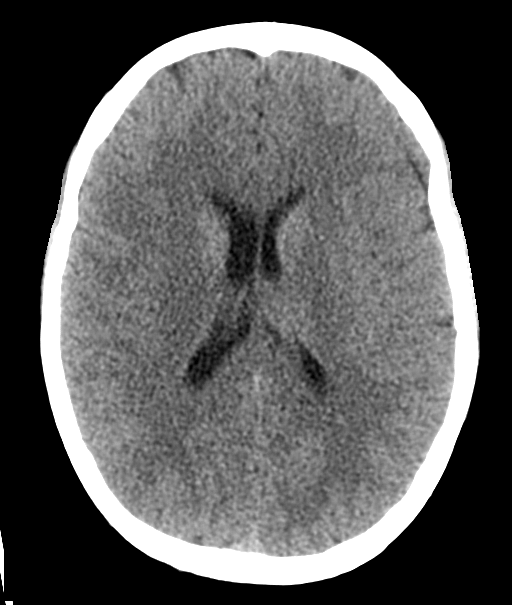
[im 22/36  bone]
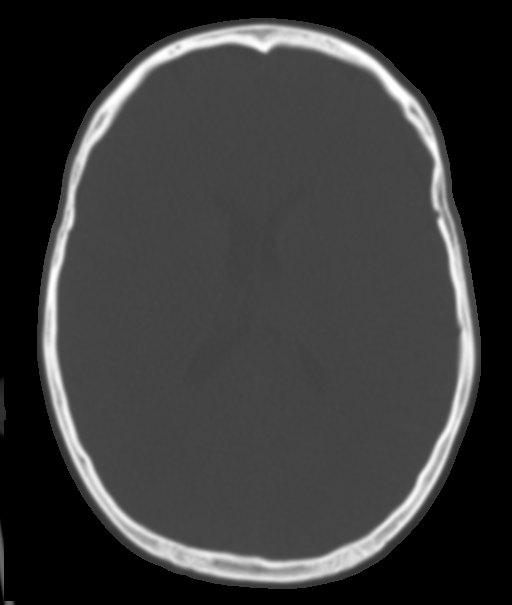
[im 27/36  brain]
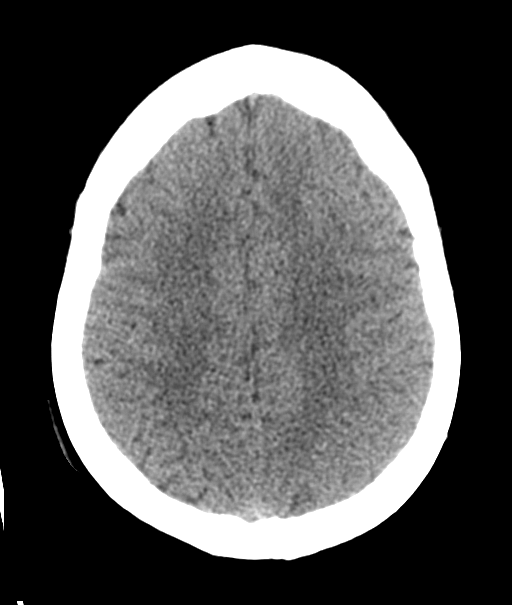
[im 31/36  brain]
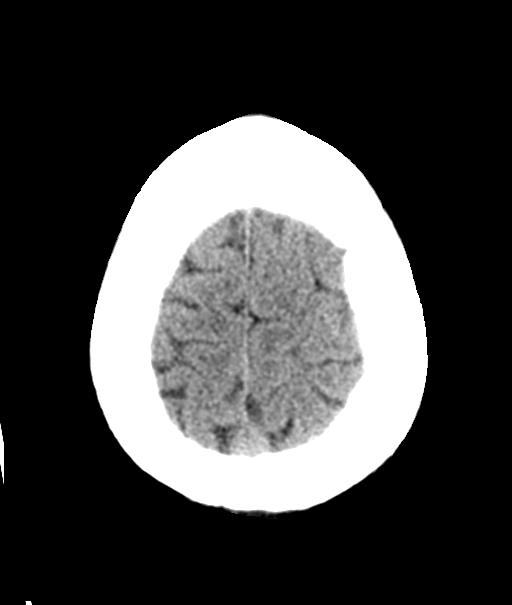

[16 of 47 positions shown; findings below may reference images not displayed]

FINDINGS: Brain: No intracranial hemorrhage, mass effect, or midline shift. No
hydrocephalus. The basilar cisterns are patent. No evidence of
territorial infarct or acute ischemia. No extra-axial or
intracranial fluid collection.

Vascular: No hyperdense vessel or unexpected calcification.

Skull: No fracture or focal lesion.

Sinuses/Orbits: Paranasal sinuses and mastoid air cells are clear.
The visualized orbits are unremarkable.

Other: None.
IMPRESSION: Negative head CT.

## 2020-06-25 ENCOUNTER — Encounter (INDEPENDENT_AMBULATORY_CARE_PROVIDER_SITE_OTHER): Payer: Self-pay

## 2023-08-12 ENCOUNTER — Other Ambulatory Visit (INDEPENDENT_AMBULATORY_CARE_PROVIDER_SITE_OTHER): Payer: Self-pay

## 2023-08-12 NOTE — Telephone Encounter (Signed)
 THIS PATIENT Janet Kirby WAS BROUGHT UP IN ERROR.   THIS SHOULD  HAVE BEEN Janet Kirby.  NO MESSAGE WAS DONE ON Janet Kirby BUT ONE WAS DONE ON Janet.    Janet Kirby    08-12-23

## 2023-10-08 DIAGNOSIS — F900 Attention-deficit hyperactivity disorder, predominantly inattentive type: Secondary | ICD-10-CM | POA: Diagnosis not present

## 2023-10-08 DIAGNOSIS — M549 Dorsalgia, unspecified: Secondary | ICD-10-CM | POA: Diagnosis not present

## 2023-10-08 DIAGNOSIS — L709 Acne, unspecified: Secondary | ICD-10-CM | POA: Diagnosis not present

## 2024-01-11 DIAGNOSIS — L709 Acne, unspecified: Secondary | ICD-10-CM | POA: Diagnosis not present

## 2024-01-11 DIAGNOSIS — F900 Attention-deficit hyperactivity disorder, predominantly inattentive type: Secondary | ICD-10-CM | POA: Diagnosis not present

## 2024-01-11 DIAGNOSIS — M549 Dorsalgia, unspecified: Secondary | ICD-10-CM | POA: Diagnosis not present

## 2024-03-07 DIAGNOSIS — M79672 Pain in left foot: Secondary | ICD-10-CM | POA: Diagnosis not present

## 2024-03-07 DIAGNOSIS — S91202A Unspecified open wound of left great toe with damage to nail, initial encounter: Secondary | ICD-10-CM | POA: Diagnosis not present

## 2024-03-21 DIAGNOSIS — M79672 Pain in left foot: Secondary | ICD-10-CM | POA: Diagnosis not present

## 2024-03-21 DIAGNOSIS — S91202D Unspecified open wound of left great toe with damage to nail, subsequent encounter: Secondary | ICD-10-CM | POA: Diagnosis not present
